# Patient Record
Sex: Male | Born: 1980 | Race: White | Hispanic: No | Marital: Married | Smoking: Never smoker
Health system: Southern US, Community
[De-identification: ages and names within clinical notes are randomized; demographics above are authoritative.]

## PROBLEM LIST (undated history)

## (undated) DIAGNOSIS — I219 Acute myocardial infarction, unspecified: Secondary | ICD-10-CM

## (undated) DIAGNOSIS — R0602 Shortness of breath: Secondary | ICD-10-CM

## (undated) DIAGNOSIS — F32A Depression, unspecified: Secondary | ICD-10-CM

## (undated) DIAGNOSIS — I259 Chronic ischemic heart disease, unspecified: Secondary | ICD-10-CM

## (undated) DIAGNOSIS — Q245 Malformation of coronary vessels: Secondary | ICD-10-CM

## (undated) DIAGNOSIS — R079 Chest pain, unspecified: Secondary | ICD-10-CM

## (undated) DIAGNOSIS — F419 Anxiety disorder, unspecified: Secondary | ICD-10-CM

## (undated) HISTORY — DX: Acute myocardial infarction, unspecified: I21.9

## (undated) HISTORY — DX: Chest pain, unspecified: R07.9

## (undated) HISTORY — DX: Malformation of coronary vessels: Q24.5

## (undated) HISTORY — DX: Shortness of breath: R06.02

## (undated) HISTORY — DX: Chronic ischemic heart disease, unspecified: I25.9

## (undated) HISTORY — DX: Depression, unspecified: F32.A

## (undated) HISTORY — PX: OTHER SURGICAL HISTORY: SHX169

## (undated) HISTORY — DX: Anxiety disorder, unspecified: F41.9

---

## 2003-04-28 ENCOUNTER — Emergency Department (HOSPITAL_COMMUNITY): Admission: EM | Admit: 2003-04-28 | Discharge: 2003-04-29 | Payer: Self-pay | Admitting: Emergency Medicine

## 2003-12-26 ENCOUNTER — Ambulatory Visit: Payer: Self-pay | Admitting: Pulmonary Disease

## 2003-12-27 ENCOUNTER — Ambulatory Visit (HOSPITAL_COMMUNITY): Admission: RE | Admit: 2003-12-27 | Discharge: 2003-12-27 | Payer: Self-pay | Admitting: Pulmonary Disease

## 2004-01-11 ENCOUNTER — Ambulatory Visit: Payer: Self-pay | Admitting: Pulmonary Disease

## 2004-02-29 ENCOUNTER — Encounter: Admission: RE | Admit: 2004-02-29 | Discharge: 2004-02-29 | Payer: Self-pay | Admitting: Neurosurgery

## 2004-05-15 ENCOUNTER — Ambulatory Visit: Payer: Self-pay | Admitting: Pulmonary Disease

## 2005-04-30 ENCOUNTER — Ambulatory Visit (HOSPITAL_COMMUNITY): Admission: RE | Admit: 2005-04-30 | Discharge: 2005-04-30 | Payer: Self-pay | Admitting: *Deleted

## 2005-04-30 HISTORY — PX: CARDIAC CATHETERIZATION: SHX172

## 2007-06-17 IMAGING — CR DG CHEST 2V
2 series · 2 of 2 positions shown · non-contrast
Comparison: none

CLINICAL DATA: Pre cardiac catheterization; chest pain.  
 CHEST - 2 VIEW:

[view not recorded (1 of 2)]
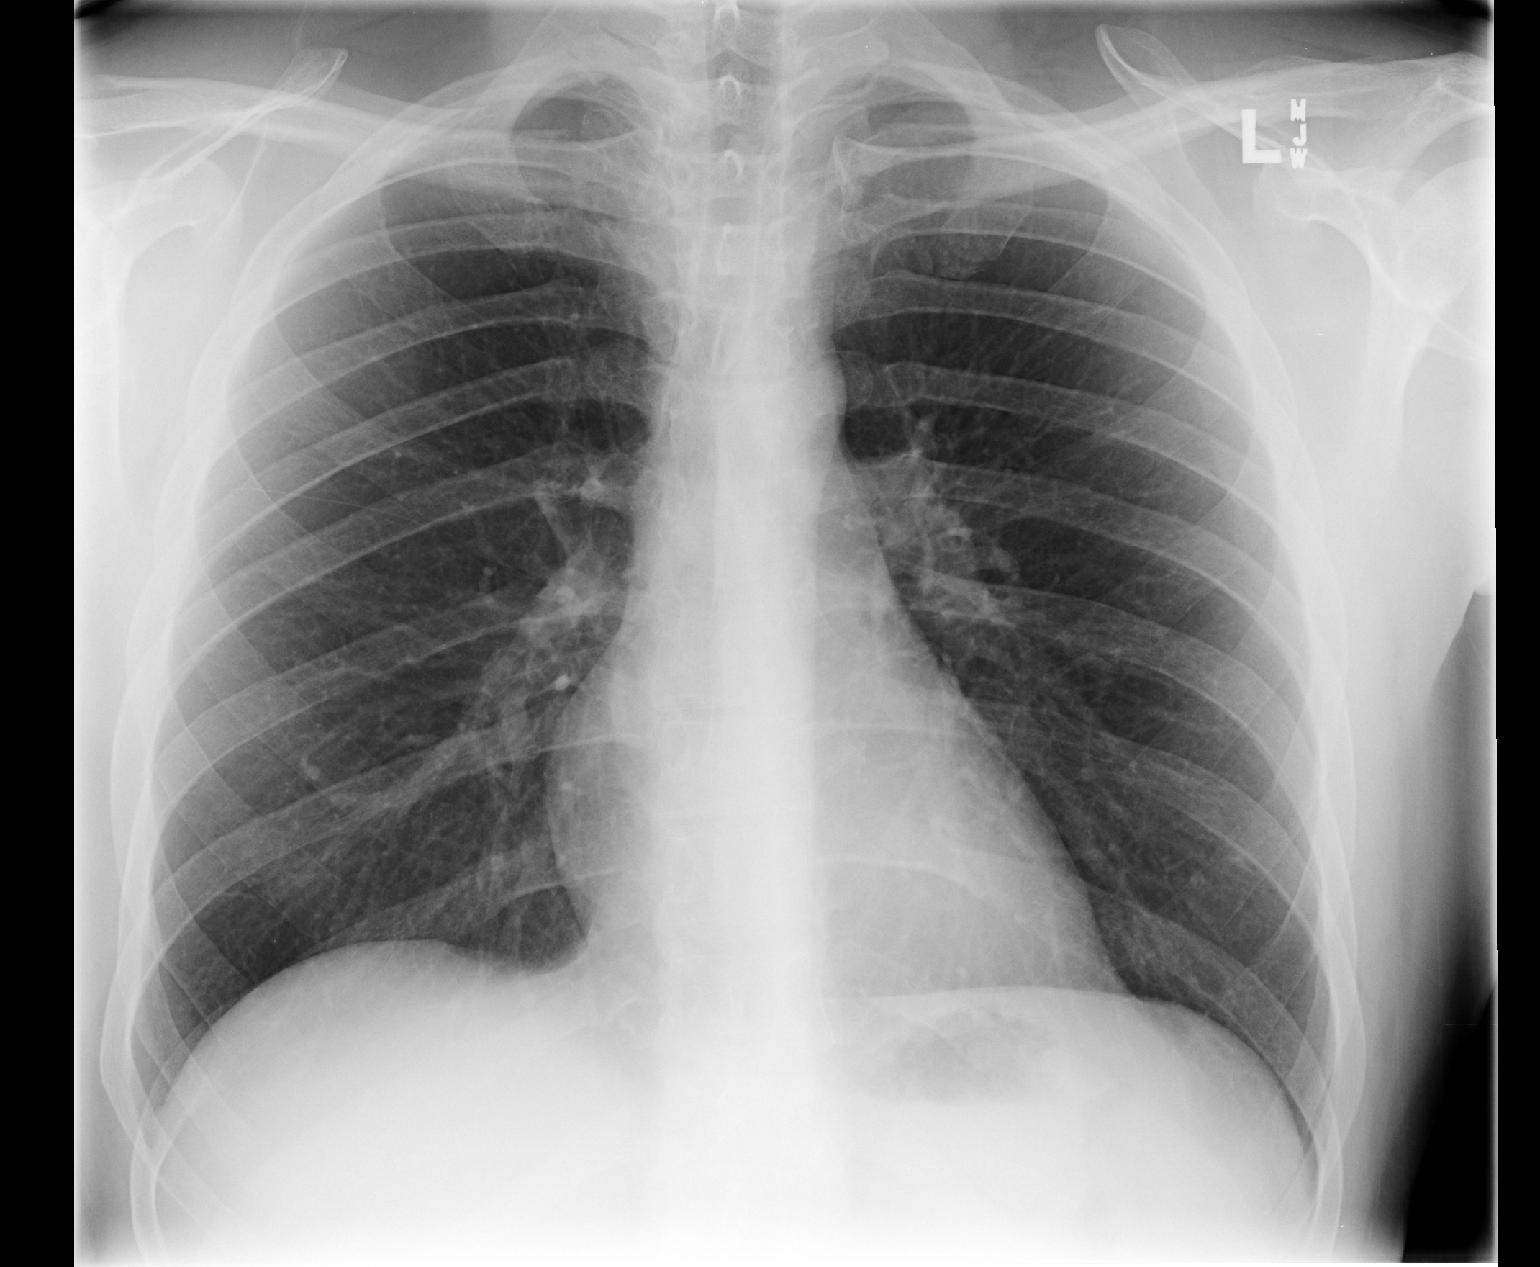

[view not recorded (2 of 2)]
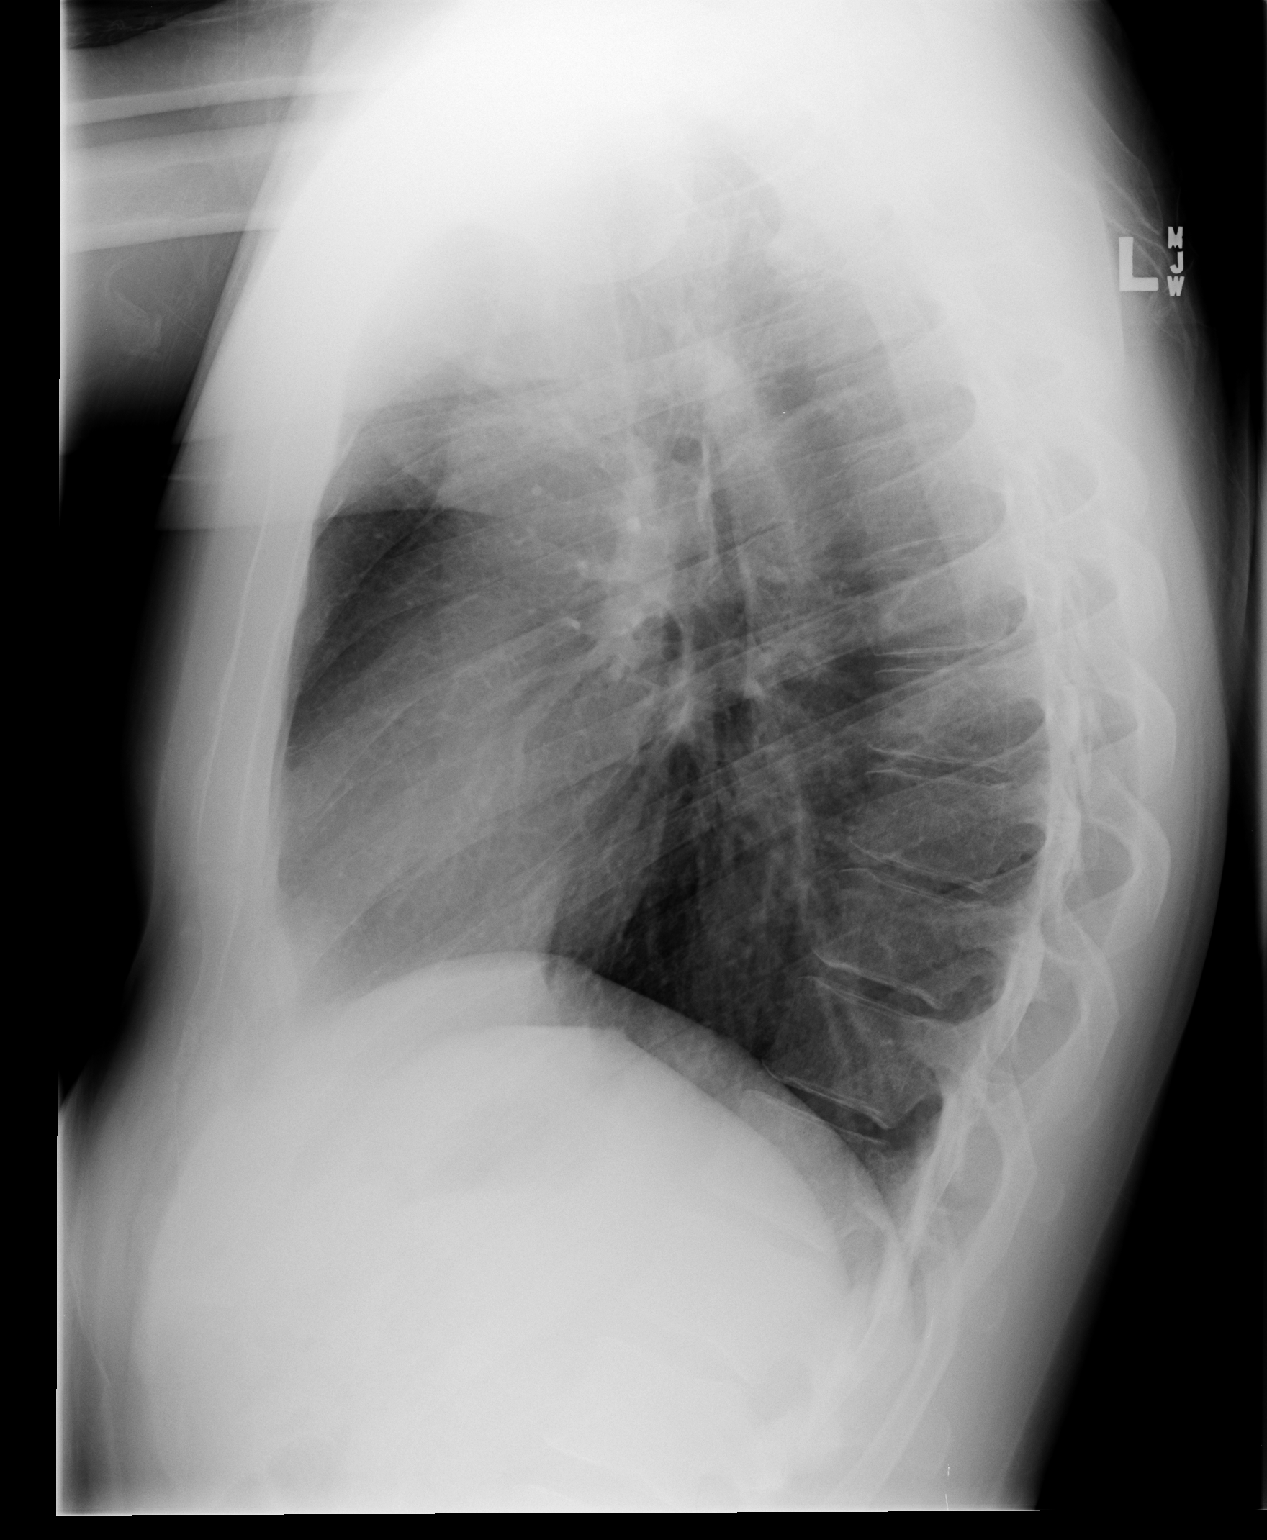

[2 of 2 positions shown; findings below may reference images not displayed]

FINDINGS: Two views of the chest show the lungs to be clear.  Probable small granuloma is present at the right lung base.  The heart is within normal limits in size.
IMPRESSION: No active lung disease.

## 2011-07-31 ENCOUNTER — Emergency Department: Payer: Self-pay | Admitting: Emergency Medicine

## 2011-07-31 LAB — TROPONIN I
Troponin-I: <0.02 ng/mL
Troponin-I: <0.02 ng/mL

## 2011-07-31 LAB — COMPREHENSIVE METABOLIC PANEL
Albumin: 4.1 g/dL (ref 3.4–5.0)
Alkaline Phosphatase: 85 U/L (ref 50–136)
Anion Gap: 6 — ABNORMAL LOW (ref 7–16)
BUN: 11 mg/dL (ref 7–18)
Bilirubin,Total: 0.5 mg/dL (ref 0.2–1.0)
Calcium, Total: 8.8 mg/dL (ref 8.5–10.1)
Chloride: 103 mmol/L (ref 98–107)
Co2: 29 mmol/L (ref 21–32)
Creatinine: 1.02 mg/dL (ref 0.60–1.30)
EGFR (African American): >60
EGFR (Non-African Amer.): >60
Glucose: 88 mg/dL (ref 65–99)
Osmolality: 274 (ref 275–301)
Potassium: 3.8 mmol/L (ref 3.5–5.1)
SGOT(AST): 21 U/L (ref 15–37)
SGPT (ALT): 36 U/L
Sodium: 138 mmol/L (ref 136–145)
Total Protein: 7.5 g/dL (ref 6.4–8.2)

## 2011-07-31 LAB — CBC
HCT: 41.2 % (ref 40.0–52.0)
HGB: 14.6 g/dL (ref 13.0–18.0)
MCH: 32.3 pg (ref 26.0–34.0)
MCHC: 35.4 g/dL (ref 32.0–36.0)
MCV: 92 fL (ref 80–100)
Platelet: 220 10*3/uL (ref 150–440)
RBC: 4.51 10*6/uL (ref 4.40–5.90)
RDW: 13.3 % (ref 11.5–14.5)
WBC: 5.2 10*3/uL (ref 3.8–10.6)

## 2011-07-31 LAB — CK TOTAL AND CKMB (NOT AT ARMC)
CK, Total: 81 U/L (ref 35–232)
CK, Total: 78 U/L (ref 35–232)
CK-MB: <0.5 ng/mL — ABNORMAL LOW (ref 0.5–3.6)
CK-MB: <0.5 ng/mL — ABNORMAL LOW (ref 0.5–3.6)

## 2011-07-31 LAB — PRO B NATRIURETIC PEPTIDE: B-Type Natriuretic Peptide: 13 pg/mL (ref 0–125)

## 2012-06-03 ENCOUNTER — Encounter: Payer: Self-pay | Admitting: Internal Medicine

## 2012-06-03 ENCOUNTER — Ambulatory Visit (INDEPENDENT_AMBULATORY_CARE_PROVIDER_SITE_OTHER): Payer: PRIVATE HEALTH INSURANCE | Admitting: Internal Medicine

## 2012-06-03 VITALS — BP 102/70 | HR 56 | Ht 73.0 in | Wt 191.0 lb

## 2012-06-03 DIAGNOSIS — R079 Chest pain, unspecified: Secondary | ICD-10-CM

## 2012-06-03 DIAGNOSIS — Q245 Malformation of coronary vessels: Secondary | ICD-10-CM

## 2012-06-03 MED ORDER — METOPROLOL TARTRATE 25 MG PO TABS: 25.0000 mg | ORAL_TABLET | Freq: Every day | ORAL | Status: DC

## 2012-06-03 MED ORDER — AMLODIPINE BESYLATE 2.5 MG PO TABS: 2.5000 mg | ORAL_TABLET | Freq: Every day | ORAL | Status: DC

## 2012-06-03 MED ORDER — ISOSORBIDE MONONITRATE ER 30 MG PO TB24: 30.0000 mg | ORAL_TABLET | Freq: Every day | ORAL | Status: DC

## 2012-06-03 NOTE — Patient Instructions (Signed)
Follow up annually. Consider trying dietary supplements as provided (L-arginine 400 mg TID, and/or COQ10 300 mg/daily)

## 2012-06-03 NOTE — Progress Notes (Signed)
OFFICE NOTE  Chief Complaint:  Routine followup  Primary Care Physician: No PCP Per Patient  HPI:  Warren Sloan is a 32 year old gentleman with history of myocardial bridging and chest pain thought due to that. He had a catheterization and a cardiac CT that confirmed this. He has also been on isosorbide, metoprolol and amlodipine and done fairly well without symptoms. Recently, he has been doing significant exercise, running 3-1/2 miles 3 times a week, also playing basketball and reports occasional chest discomfort; however, it is not lifestyle limiting. He has done very well with the medicines. He has no specific complaints today.  We recently performed metabolic testing in September of 2013, which demonstrated a peak VO2 at 81% predicted.  The heart rate and VO2 curves not demonstrate an ischemic response. There was mild chronotropic incompetence secondary to medication.  PMHx:  Past Medical History  Diagnosis Date  . Chest pain   . Shortness of breath     MET TEST, 09/29/2011  . Chronic ischemic heart disease     2D ECHO, 07/11/2003 - EF 59%, normal  . Myocardial bridge     Past Surgical History  Procedure Laterality Date  . 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 CVA Maternal Grandmother 43  . Irritable bowel syndrome Paternal Grandmother   . Hypertension Paternal Grandfather     SOCHx:   reports that he has never smoked. He does not have any smokeless tobacco history on file. He reports that  drinks alcohol. He reports that he does not use illicit drugs.  ALLERGIES:  No Known Allergies  ROS: A comprehensive review of systems was negative.  HOME MEDS: Current Outpatient Prescriptions  Medication Sig Dispense Refill  . amLODipine (NORVASC) 2.5 MG tablet Take 1 tablet (2.5 mg total) by mouth daily.  30 tablet  11  . isosorbide  mononitrate (IMDUR) 30 MG 24 hr tablet Take 1 tablet (30 mg total) by mouth daily.  30 tablet  11  . metoprolol tartrate (LOPRESSOR) 25 MG tablet Take 1 tablet (25 mg total) by mouth daily.  30 tablet  11   No current facility-administered medications for this visit.    LABS/IMAGING: No results found for this or any previous visit (from the past 48 hour(s)). No results found.  VITALS: BP 102/70  Pulse 56  Ht 6\' 1"  (1.854 m)  Wt 191 lb (86.637 kg)  BMI 25.2 kg/m2  EXAM: General appearance: alert and no distress Neck: no adenopathy, no carotid bruit, no JVD, supple, symmetrical, trachea midline and thyroid not enlarged, symmetric, no tenderness/mass/nodules Lungs: clear to auscultation bilaterally Heart: regular rate and rhythm, S1, S2 normal, no murmur, click, rub or gallop Abdomen: soft, non-tender; bowel sounds normal; no masses,  no organomegaly Extremities: extremities normal, atraumatic, no cyanosis or edema Pulses: 2+ and symmetric Skin: Skin color, texture, turgor normal. No rashes or lesions Neurologic: Grossly normal  EKG: Sinus bradycardia at 56  ASSESSMENT: 1. Coronary artery bridging 2. Chest pain associated with coronary artery spasm  PLAN: 1.   Warren Sloan is doing very well on his current regimen regimen of medications. He has very infrequent episodes of chest discomfort, mostly associated with stress.  His recent metabolic testing indicated to reduce VO2, but no evidence for ischemia. He continues to exercise and I have encouraged more aerobic exercise to help increase  his VO2.  I've also recommended arginine and/or coenzyme Q 10 supplementation which may be helpful with small vessel ischemia.  Plan to see him back annually or sooner as necessary.  Chrystie Nose, MD, [facility] Attending Cardiologist [facility]  Lorin Gawron C 06/03/2012, 1:22 PM

## 2012-07-14 ENCOUNTER — Other Ambulatory Visit: Payer: Self-pay

## 2012-07-14 DIAGNOSIS — Q245 Malformation of coronary vessels: Secondary | ICD-10-CM

## 2012-07-14 MED ORDER — AMLODIPINE BESYLATE 2.5 MG PO TABS: 2.5000 mg | ORAL_TABLET | Freq: Every day | ORAL | Status: DC

## 2012-07-14 MED ORDER — ISOSORBIDE MONONITRATE ER 30 MG PO TB24: 30.0000 mg | ORAL_TABLET | Freq: Every day | ORAL | Status: DC

## 2012-07-14 NOTE — Telephone Encounter (Signed)
Rx was called in to pharmacy. 

## 2012-07-14 NOTE — Addendum Note (Signed)
Addended by: Neta Ehlers on: 07/14/2012 05:17 PM   Modules accepted: Orders

## 2013-07-10 ENCOUNTER — Other Ambulatory Visit: Payer: Self-pay | Admitting: *Deleted

## 2013-07-10 DIAGNOSIS — Q245 Malformation of coronary vessels: Secondary | ICD-10-CM

## 2013-07-10 MED ORDER — AMLODIPINE BESYLATE 2.5 MG PO TABS: 2.5000 mg | ORAL_TABLET | Freq: Every day | ORAL | Status: DC

## 2013-08-02 ENCOUNTER — Other Ambulatory Visit: Payer: Self-pay | Admitting: Internal Medicine

## 2013-08-02 ENCOUNTER — Ambulatory Visit (INDEPENDENT_AMBULATORY_CARE_PROVIDER_SITE_OTHER): Payer: PRIVATE HEALTH INSURANCE | Admitting: Internal Medicine

## 2013-08-02 ENCOUNTER — Encounter: Payer: Self-pay | Admitting: Internal Medicine

## 2013-08-02 VITALS — BP 106/68 | HR 63 | Ht 73.0 in | Wt 175.0 lb

## 2013-08-02 DIAGNOSIS — Q245 Malformation of coronary vessels: Secondary | ICD-10-CM

## 2013-08-02 DIAGNOSIS — R072 Precordial pain: Secondary | ICD-10-CM

## 2013-08-02 MED ORDER — METOPROLOL SUCCINATE ER 25 MG PO TB24: 12.5000 mg | ORAL_TABLET | Freq: Every day | ORAL | Status: DC

## 2013-08-02 NOTE — Progress Notes (Signed)
OFFICE NOTE  Chief Complaint:  Routine followup  Primary Care Physician: No PCP Per Patient  HPI:  Warren Sloan is a 33 year old gentleman with history of myocardial bridging and chest pain thought due to that. He had a catheterization and a cardiac CT that confirmed this. He has also been on isosorbide, metoprolol and amlodipine and done fairly well without symptoms. Recently, he has been doing significant exercise, running 3-1/2 miles 3 times a week, also playing basketball and reports occasional chest discomfort; however, it is not lifestyle limiting. He has done very well with the medicines. He has no specific complaints today.  We recently performed metabolic testing in September of 2013, which demonstrated a peak VO2 at 81% predicted.  The heart rate and VO2 curves not demonstrate an ischemic response. There was mild chronotropic incompetence secondary to medication.  He returns today feeling quite well. He has very infrequent episodes of angina. He has been taking his beta blocker once daily. It is unclear whether he is on Toprol tartrate or succinate. During his cardiomegaly testing in 2013 and was mild chronotropic incompetence. He reports he does get some dizziness with positional changes.  PMHx:  Past Medical History  Diagnosis Date  . Chest pain   . Shortness of breath     MET TEST, 09/29/2011  . Chronic ischemic heart disease     2D ECHO, 07/11/2003 - EF 59%, normal  . Myocardial bridge     Past Surgical History  Procedure Laterality Date  . 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 CVA Maternal Grandmother 54  . Irritable bowel syndrome Paternal Grandmother   . Hypertension Paternal Grandfather     SOCHx:   reports that he has never smoked. He does not have any smokeless tobacco history on file. He reports that he  drinks alcohol. He reports that he does not use illicit drugs.  ALLERGIES:  No Known Allergies  ROS: A comprehensive review of systems was negative except for: Neurological: positive for dizziness  HOME MEDS: Current Outpatient Prescriptions  Medication Sig Dispense Refill  . amLODipine (NORVASC) 2.5 MG tablet Take 1 tablet (2.5 mg total) by mouth daily.  30 tablet  0  . isosorbide mononitrate (IMDUR) 30 MG 24 hr tablet Take 1 tablet (30 mg total) by mouth daily.  90 tablet  3  . metoprolol tartrate (LOPRESSOR) 25 MG tablet Take 1 tablet (25 mg total) by mouth daily.  30 tablet  11  . metoprolol succinate (TOPROL XL) 25 MG 24 hr tablet Take 0.5 tablets (12.5 mg total) by mouth daily.  15 tablet  11   No current facility-administered medications for this visit.    LABS/IMAGING: No results found for this or any previous visit (from the past 48 hour(s)). No results found.  VITALS: BP 106/68  Pulse 63  Ht '6\' 1"'  (1.854 m)  Wt 175 lb (79.379 kg)  BMI 23.09 kg/m2  EXAM: General appearance: alert and no distress Neck: no adenopathy, no carotid bruit, no JVD, supple, symmetrical, trachea midline and thyroid not enlarged, symmetric, no tenderness/mass/nodules Lungs: clear to auscultation bilaterally Heart: regular rate and rhythm, S1, S2 normal, no murmur, click, rub or gallop Abdomen: soft, non-tender; bowel sounds normal; no masses,  no organomegaly Extremities: extremities normal, atraumatic, no cyanosis or edema Pulses: 2+ and symmetric Skin: Skin color, texture,  turgor normal. No rashes or lesions Neurologic: Grossly normal  EKG: Normal sinus rhythm at 63  ASSESSMENT: 1. Coronary artery bridging 2. 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 Positional dizziness  PLAN: 1.   [name] is doing very well on his current regimen regimen of medications. He is very infrequent anginal episodes. He has been exercising more which is overall very good for his health. His  blood pressure continues to run low normal and he does have some positional dizziness. This could be do to some mild chronotropic incompetence was noted during his cardia metabolic testing. I would therefore recommend decreasing his Toprol-XL to 12.5 mg daily. He would like to wean off some medications if possible. I not sure if we can do that however the only way to his try to wean down his medicines and see if he has any more chest pain. Plan to see him back annually or sooner as necessary. He is to contact me if he is having any more angina.  Pixie Casino, MD, [facility] Attending Cardiologist [facility] 08/02/2013, 8:13 AM

## 2013-08-02 NOTE — Patient Instructions (Signed)
Your physician has recommended you make the following change in your medication... 1. Cut your current metoprolol tablets in 1/2 and take 1/2 tablet daily until you run out 2. When this medication runs out, pick up the new prescription for metoprolol succinate (Toprol XL) 12.5mg  (1/2 tablet) once daily.   Your physician wants you to follow-up in: 1 year with Dr. Rennis GoldenHilty. You will receive a reminder letter in the mail two months in advance. If you don't receive a letter, please call our office to schedule the follow-up appointment.

## 2013-08-03 ENCOUNTER — Other Ambulatory Visit: Payer: Self-pay | Admitting: *Deleted

## 2013-08-03 DIAGNOSIS — Q245 Malformation of coronary vessels: Secondary | ICD-10-CM

## 2013-08-03 MED ORDER — AMLODIPINE BESYLATE 2.5 MG PO TABS: 2.5000 mg | ORAL_TABLET | Freq: Every day | ORAL | Status: DC

## 2013-08-03 MED ORDER — METOPROLOL SUCCINATE ER 25 MG PO TB24: 12.5000 mg | ORAL_TABLET | Freq: Every day | ORAL | Status: DC

## 2013-08-03 NOTE — Telephone Encounter (Signed)
Rx was sent to pharmacy electronically. 

## 2013-08-23 ENCOUNTER — Telehealth: Payer: Self-pay | Admitting: Internal Medicine

## 2013-08-23 NOTE — Telephone Encounter (Signed)
WANTED MORE DETAILS? NO ANSWER --LEFT MESSAGE WILL INFORMED PATIENT WHEN DR HILTY REVIEWED

## 2013-08-23 NOTE — Telephone Encounter (Signed)
Pt called in stating that Dr.Hilty lowered his dosage to taking 1/2 tab of the metoprolol and he stated that he having some chest pains and would like to go back to taking the 1 tab. Please call  Thanks

## 2013-08-25 MED ORDER — METOPROLOL SUCCINATE ER 25 MG PO TB24: 25.0000 mg | ORAL_TABLET | Freq: Every day | ORAL | Status: DC

## 2013-08-25 NOTE — Telephone Encounter (Addendum)
Spoke to patient.  Aware , will e -sent new rx Metoprolol er 25 mg   #90 x3

## 2013-08-25 NOTE — Addendum Note (Signed)
Addended by: Tobin ChadMARTIN, SHARON V. on: 08/25/2013 08:08 AM   Modules accepted: Orders

## 2013-08-25 NOTE — Telephone Encounter (Signed)
Ok to go back to a full tab.  Dr. HRexene Edison

## 2013-09-16 IMAGING — CR DG CHEST 2V
1 series · 2 of 2 positions shown · non-contrast
Comparison: none

REASON FOR EXAM: Chest Pain
COMMENTS:

[Series 1: w chest pa · 0.14mm/px · 2 of 2 slices shown]
[im 1/2]
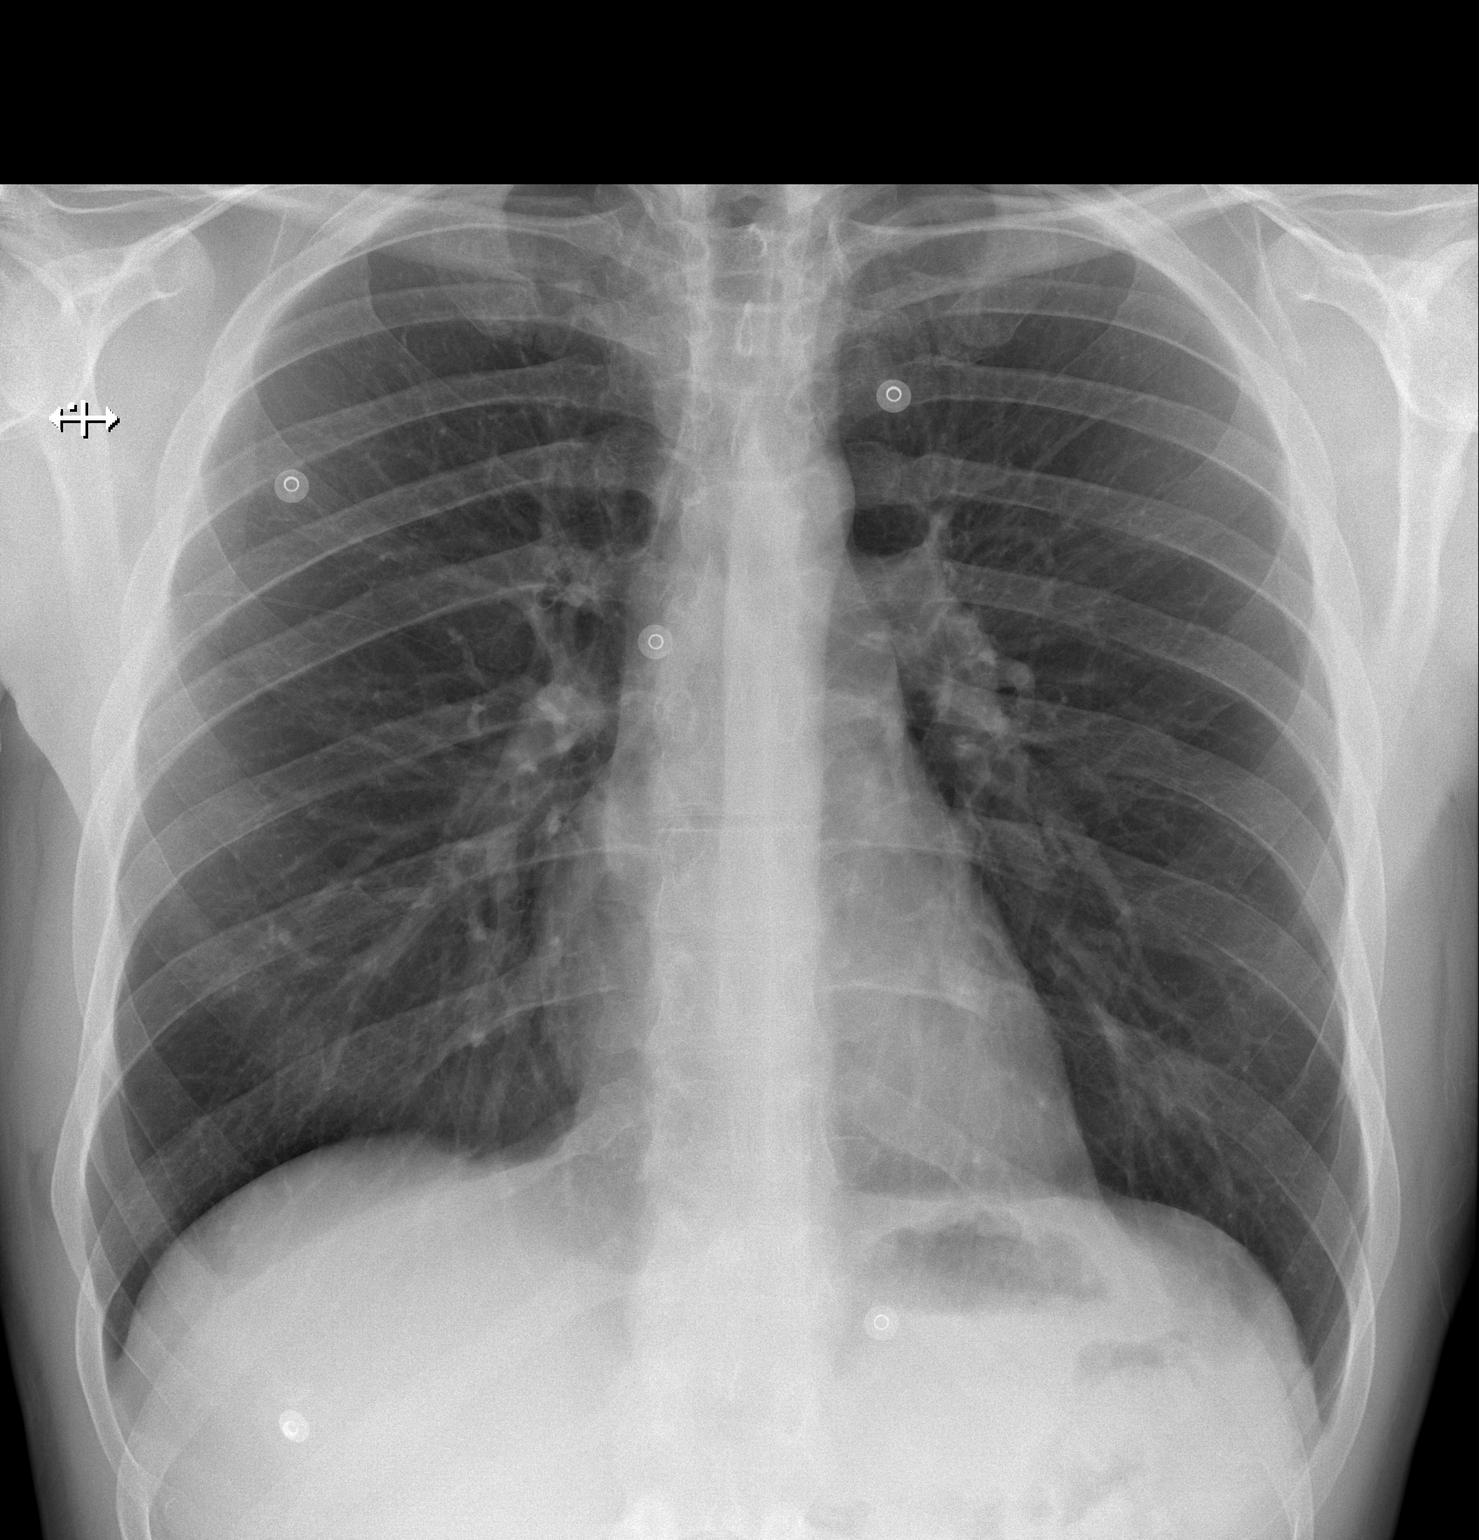
[im 2/2]
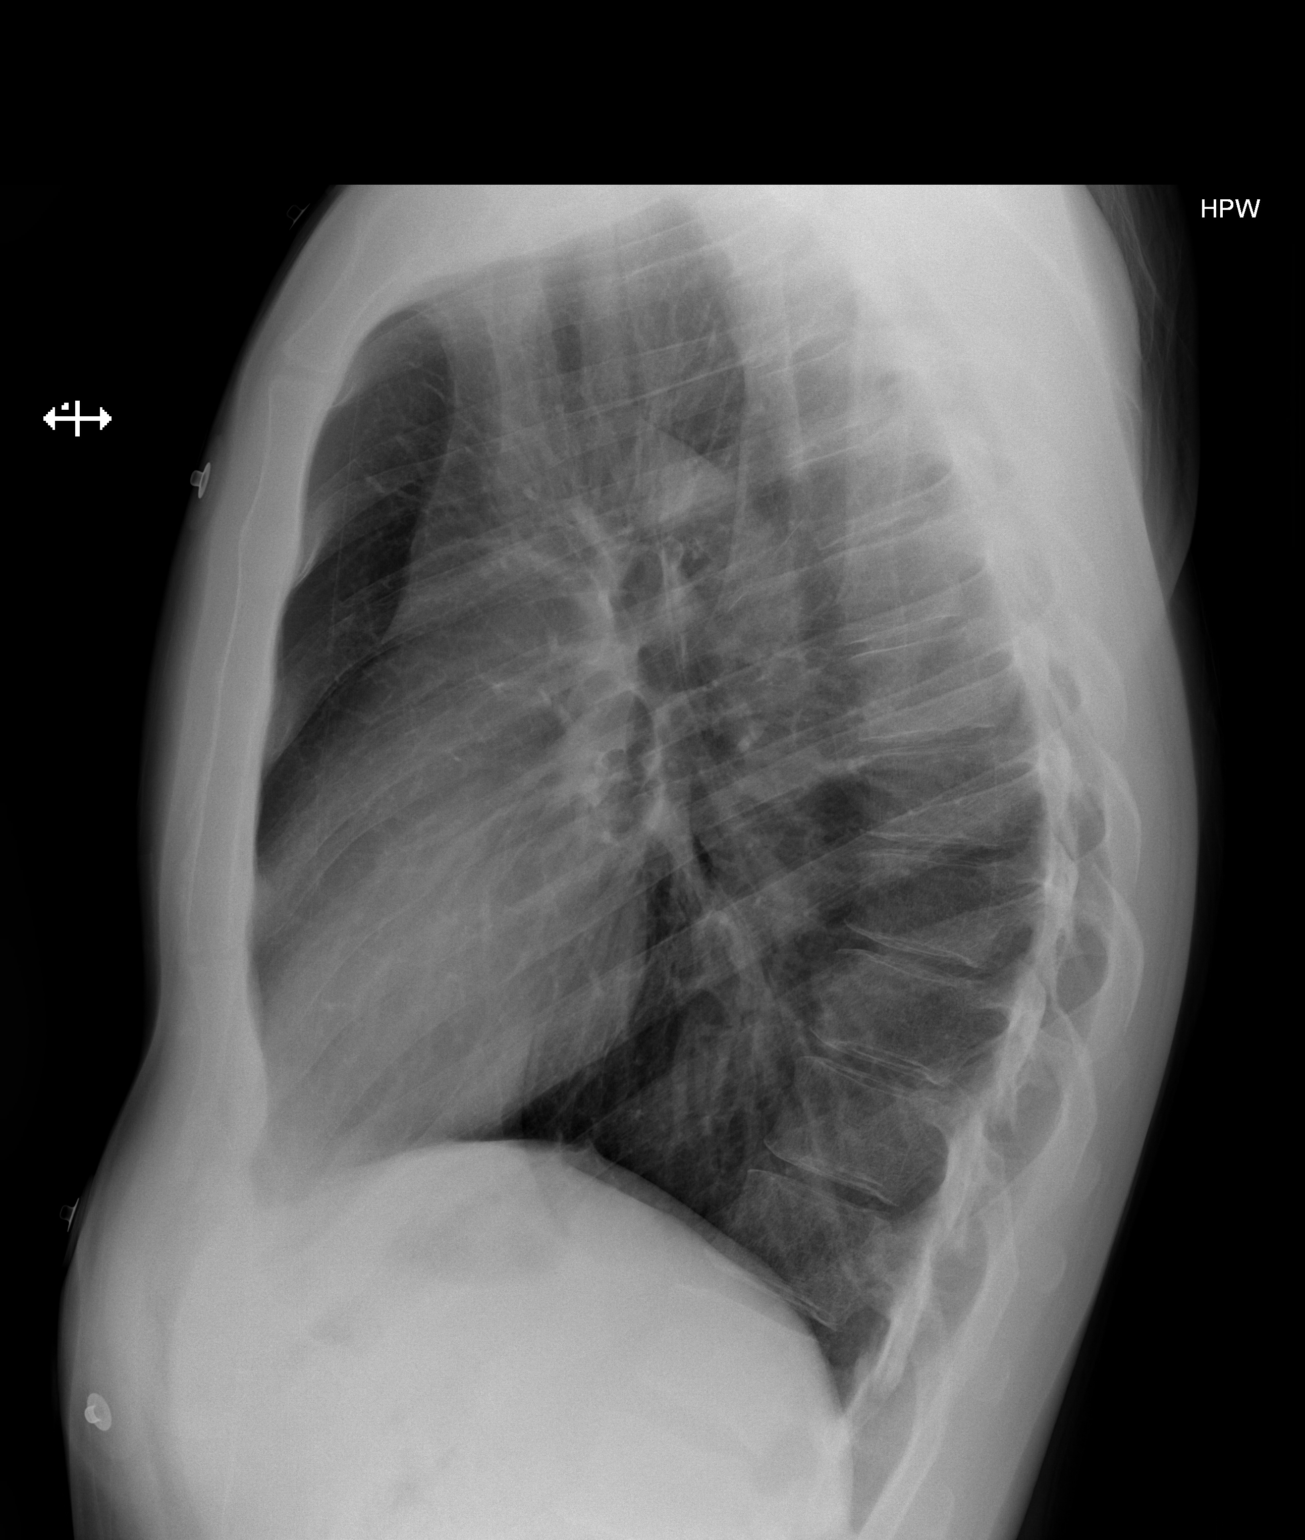

[2 of 2 positions shown; findings below may reference images not displayed]

PROCEDURE:     DXR - DXR CHEST PA (OR AP) AND LATERAL  - July 31, 2011  [DATE]

RESULT:     There is no previous exam for comparison.

The lungs are clear. The heart and pulmonary vessels are normal. The bony
and mediastinal structures are unremarkable. There is no effusion. There is
no pneumothorax or evidence of congestive failure.
IMPRESSION: No acute cardiopulmonary disease.

[REDACTED]

## 2014-03-09 ENCOUNTER — Ambulatory Visit (INDEPENDENT_AMBULATORY_CARE_PROVIDER_SITE_OTHER): Payer: PRIVATE HEALTH INSURANCE | Admitting: Internal Medicine

## 2014-03-09 ENCOUNTER — Encounter: Payer: Self-pay | Admitting: Internal Medicine

## 2014-03-09 VITALS — BP 110/80 | HR 55 | Ht 73.0 in | Wt 179.0 lb

## 2014-03-09 DIAGNOSIS — R0789 Other chest pain: Secondary | ICD-10-CM

## 2014-03-09 DIAGNOSIS — R002 Palpitations: Secondary | ICD-10-CM | POA: Insufficient documentation

## 2014-03-09 DIAGNOSIS — Q245 Malformation of coronary vessels: Secondary | ICD-10-CM

## 2014-03-09 NOTE — Patient Instructions (Signed)
Your physician has recommended you make the following change in your medication...  >> take 1/2 tablet (12.5mg ) of metoprolol succinate around 7-8am and then another dose around 4-5pm  Your physician recommends that you schedule a follow-up appointment in: 1 month

## 2014-03-09 NOTE — Progress Notes (Signed)
OFFICE NOTE  Chief Complaint:  Palpitations  Primary Care Physician: No PCP Per Patient  HPI:  Warren Sloan is a 34 year old gentleman with history of myocardial bridging and chest pain thought due to that. He had a catheterization and a cardiac CT that confirmed this. He has also been on isosorbide, metoprolol and amlodipine and done fairly well without symptoms. Recently, he has been doing significant exercise, running 3-1/2 miles 3 times a week, also playing basketball and reports occasional chest discomfort; however, it is not lifestyle limiting. He has done very well with the medicines. He has no specific complaints today.  We recently performed metabolic testing in September of 2013, which demonstrated a peak VO2 at 81% predicted.  The heart rate and VO2 curves not demonstrate an ischemic response. There was mild chronotropic incompetence secondary to medication.  He returns today feeling quite well. He has very infrequent episodes of angina. He has been taking his beta blocker once daily. It is unclear whether he is on Toprol tartrate or succinate. During his cardiomegaly testing in 2013 and was mild chronotropic incompetence. He reports he does get some dizziness with positional changes.  I saw Warren Sloan back in the office today. He is now complaining of some palpitations. He reports over the last several weeks she's had more consistent palpitations that are worse during the early afternoon. He says usually around 3 or 4:00 he starts to get some palpitations. He feels like a fluttering that comes and goes and sometimes lasts for a few seconds. He says it feels like he's either skipping beats or having extra heartbeats. He does have a history of PACs and atrial runs by monitoring the past back in 2006. It seems to have been well controlled on his beta blocker however it may be breaking through his current dose. He denies any chest pain that is different from his level chest  pain before. He continues to exercise and reports no worsening stress other than his normal amount.  PMHx:  Past Medical History  Diagnosis Date  . Chest pain   . Shortness of breath     MET TEST, 09/29/2011  . Chronic ischemic heart disease     2D ECHO, 07/11/2003 - EF 59%, normal  . Myocardial bridge     Past Surgical History  Procedure Laterality Date  . 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 CVA Maternal Grandmother 32  . Irritable bowel syndrome Paternal Grandmother   . Hypertension Paternal Grandfather     SOCHx:   reports that he has never smoked. He does not have any smokeless tobacco history on file. He reports that he drinks alcohol. He reports that he does not use illicit drugs.  ALLERGIES:  No Known Allergies  ROS: A comprehensive review of systems was negative except for: Cardiovascular: positive for palpitations  HOME MEDS: Current Outpatient Prescriptions  Medication Sig Dispense Refill  . amLODipine (NORVASC) 2.5 MG tablet Take 1 tablet (2.5 mg total) by mouth daily. 90 tablet 3  . isosorbide mononitrate (IMDUR) 30 MG 24 hr tablet TAKE ONE TABLET BY MOUTH ONCE DAILY 90 tablet 3  . metoprolol succinate (TOPROL-XL) 25 MG 24 hr tablet Take 1/2 tablet (12.51m) by mouth 7-8am. Take 1/2 tablet by mouth 4-5pm.     No current facility-administered medications for this visit.    LABS/IMAGING:  No results found for this or any previous visit (from the past 48 hour(s)). No results found.  VITALS: BP 110/80 mmHg  Pulse 55  Ht '6\' 1"'  (1.854 m)  Wt 179 lb (81.194 kg)  BMI 23.62 kg/m2  EXAM: General appearance: alert and no distress Neck: no adenopathy, no carotid bruit, no JVD, supple, symmetrical, trachea midline and thyroid not enlarged, symmetric, no tenderness/mass/nodules Lungs: clear to auscultation bilaterally Heart:  regular rate and rhythm, S1, S2 normal, no murmur, click, rub or gallop Abdomen: soft, non-tender; bowel sounds normal; no masses,  no organomegaly Extremities: extremities normal, atraumatic, no cyanosis or edema Pulses: 2+ and symmetric Skin: Skin color, texture, turgor normal. No rashes or lesions Neurologic: Grossly normal  EKG: Sinus bradycardia 55  ASSESSMENT: 1. Coronary artery bridging 2. 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 Palpitations  PLAN: 1.   Mr. Warren Sloan is doing very well on his current regimen regimen of medications. There is no significant increase in the level of his perceived anginal episodes. He is now reporting palpitations which have come more frequently. They tend to be in the afternoon. This may be associated with waning of his beta blocker. I think one option would be to consider monitoring however he does have a history of PACs and atrial tachycardia. I suspect this is more the same. He is not interested in a monitor but does want to make medication adjustment. We'll go ahead and switch him to short acting metoprolol 12.5 twice a day which is an equivalent dose. If he continues to have problems in the afternoon or evening and then he could take an extra 12.5 mg in the afternoon. He should adjust his dosing to start the morning dose around 8 or 9:00 in the afternoon dose around 4 which will provide better coverage. We could also consider dosing him 12.5 mg 3 times daily for complete daily coverage. Plan to see him back in about a month to see if this is helped his symptoms.  Pixie Casino, MD, [facility] Attending Cardiologist [facility] 03/09/2014, 12:43 PM

## 2014-03-13 ENCOUNTER — Encounter: Payer: Self-pay | Admitting: Internal Medicine

## 2014-03-14 ENCOUNTER — Ambulatory Visit (INDEPENDENT_AMBULATORY_CARE_PROVIDER_SITE_OTHER): Payer: PRIVATE HEALTH INSURANCE | Admitting: Internal Medicine

## 2014-03-14 ENCOUNTER — Encounter: Payer: Self-pay | Admitting: Internal Medicine

## 2014-03-14 VITALS — BP 110/72 | HR 62 | Temp 98.4°F | Wt 176.0 lb

## 2014-03-14 DIAGNOSIS — Q245 Malformation of coronary vessels: Secondary | ICD-10-CM

## 2014-03-14 DIAGNOSIS — F39 Unspecified mood [affective] disorder: Secondary | ICD-10-CM

## 2014-03-14 DIAGNOSIS — I259 Chronic ischemic heart disease, unspecified: Secondary | ICD-10-CM

## 2014-03-14 MED ORDER — ESCITALOPRAM OXALATE 10 MG PO TABS: 10.0000 mg | ORAL_TABLET | Freq: Every day | ORAL | Status: DC

## 2014-03-14 NOTE — Progress Notes (Signed)
Pre visit review using our clinic review tool, if applicable. No additional management support is needed unless otherwise documented below in the visit note. 

## 2014-03-14 NOTE — Assessment & Plan Note (Signed)
Worse recently Discussed treatment options with different SSRI He would like to try Lexapro again RX given Support offered He will update me in 4 weeks on how he is doing

## 2014-03-14 NOTE — Progress Notes (Signed)
HPI  Pt presents to the clinic today to establish care and for management of the conditions listed below. He has not had a PCP in many years but reports he has been seeing a PA at his work. He did have a full panel of labs 08/2013.  Flu: 11/2013 Tetanus:  > 15 years ago Dentist: biannually  Chronic Ischemic Heart Disease: Secondary to myocardial bridge. Does follow with cardiology. Just had a follow up with them 03/09/14. He is taking Metoprolol, Isosorbide and Amlodipine daily as prescribed. He occasionally has chest pain and shortness of breath.  He does reports changes in mood. He reports this started over the last 8 months. He reports he gets angrily easily and then sad at times. He does feel stressed at work. He has been treated for this about 12 years ago when he was first diagnosed with his myocardial bridge. He has been Lexapro in the past but was able to wean off it. He denies SI/HI but reports he just does not feel himself. He is interested in restarting treatment.  Past Medical History  Diagnosis Date  . Chest pain   . Shortness of breath     MET TEST, 09/29/2011  . Chronic ischemic heart disease     2D ECHO, 07/11/2003 - EF 59%, normal  . Myocardial bridge     Current Outpatient Prescriptions  Medication Sig Dispense Refill  . amLODipine (NORVASC) 2.5 MG tablet Take 1 tablet (2.5 mg total) by mouth daily. 90 tablet 3  . isosorbide mononitrate (IMDUR) 30 MG 24 hr tablet TAKE ONE TABLET BY MOUTH ONCE DAILY 90 tablet 3  . metoprolol succinate (TOPROL-XL) 25 MG 24 hr tablet Take 1/2 tablet (12.29m) by mouth 7-8am. Take 1/2 tablet by mouth 4-5pm.     No current facility-administered medications for this visit.    No Known Allergies  Family History  Problem Relation Age of Onset  . CVA Maternal Grandmother 68 . Irritable bowel syndrome Paternal Grandmother   . Hypertension Paternal Grandfather     History   Social History  . Marital Status: Married    Spouse Name: N/A  .  Number of Children: N/A  . Years of Education: N/A   Occupational History  . Not on file.   Social History Main Topics  . Smoking status: Never Smoker   . Smokeless tobacco: Never Used  . Alcohol Use: 0.0 oz/week    0 Standard drinks or equivalent per week     Comment: Occasionally  . Drug Use: No  . Sexual Activity: Not on file   Other Topics Concern  . Not on file   Social History Narrative    ROS:  Constitutional: Denies fever, malaise, fatigue, headache or abrupt weight changes.  Respiratory: Denies difficulty breathing, shortness of breath, cough or sputum production.   Cardiovascular: Denies chest pain, chest tightness, palpitations or swelling in the hands or feet.  Skin: Denies redness, rashes, lesions or ulcercations.  Neurological: Denies dizziness, difficulty with memory, difficulty with speech or problems with balance and coordination.  Psych: Pt reports anxiety and depression. Denies SI/HI.  No other specific complaints in a complete review of systems (except as listed in HPI above).  PE:  BP 110/72 mmHg  Pulse 62  Temp(Src) 98.4 F (36.9 C) (Oral)  Wt 176 lb (79.833 kg)  SpO2 98% Wt Readings from Last 3 Encounters:  03/14/14 176 lb (79.833 kg)  03/09/14 179 lb (81.194 kg)  08/02/13 175 lb (79.379 kg)  General: Appears his stated age, well developed, well nourished in NAD. Cardiovascular: Normal rate and rhythm. S1,S2 noted.  No murmur, rubs or gallops noted.  Pulmonary/Chest: Normal effort and positive vesicular breath sounds. No respiratory distress. No wheezes, rales or ronchi noted.  Neurological: Alert and oriented.  Psychiatric: Mood and affect slightly flat. Behavior is normal. Judgment and thought content normal.    Assessment and Plan:  Chronic ischemic heart disease:  Secondary to myocardial bridge Continue current medications Labs and Cardiology note reviewed Continue to follow with Cardiology  RTC in 6 months or sooner if  needed

## 2014-03-14 NOTE — Patient Instructions (Signed)

## 2014-04-16 ENCOUNTER — Ambulatory Visit: Payer: PRIVATE HEALTH INSURANCE | Admitting: Internal Medicine

## 2014-06-07 ENCOUNTER — Other Ambulatory Visit: Payer: Self-pay | Admitting: Internal Medicine

## 2014-09-07 ENCOUNTER — Other Ambulatory Visit: Payer: Self-pay | Admitting: *Deleted

## 2014-09-07 DIAGNOSIS — Q245 Malformation of coronary vessels: Secondary | ICD-10-CM

## 2014-09-07 MED ORDER — AMLODIPINE BESYLATE 2.5 MG PO TABS: 2.5000 mg | ORAL_TABLET | Freq: Every day | ORAL | Status: DC

## 2014-09-17 ENCOUNTER — Other Ambulatory Visit: Payer: Self-pay | Admitting: Internal Medicine

## 2014-10-19 ENCOUNTER — Other Ambulatory Visit: Payer: Self-pay | Admitting: Internal Medicine

## 2014-11-12 ENCOUNTER — Encounter: Payer: Self-pay | Admitting: Internal Medicine

## 2014-11-12 ENCOUNTER — Ambulatory Visit (INDEPENDENT_AMBULATORY_CARE_PROVIDER_SITE_OTHER): Payer: PRIVATE HEALTH INSURANCE | Admitting: Internal Medicine

## 2014-11-12 VITALS — BP 118/70 | HR 58 | Temp 98.4°F | Wt 182.0 lb

## 2014-11-12 DIAGNOSIS — F39 Unspecified mood [affective] disorder: Secondary | ICD-10-CM | POA: Diagnosis not present

## 2014-11-12 MED ORDER — ESCITALOPRAM OXALATE 10 MG PO TABS: 10.0000 mg | ORAL_TABLET | Freq: Every day | ORAL | Status: DC

## 2014-11-12 NOTE — Progress Notes (Signed)
Pre visit review using our clinic review tool, if applicable. No additional management support is needed unless otherwise documented below in the visit note. 

## 2014-11-12 NOTE — Assessment & Plan Note (Signed)
Controlled on Lexapro He recently had liver enzymes done at work, all normal- he will provide me with a copy  RTC in 1 year for your annual exam

## 2014-11-12 NOTE — Patient Instructions (Signed)
Dysphoria °Dysphoria is a condition in which a person feels unpleasant or uncomfortable. It may involve mood changes and feelings of sadness, anxiety, irritability, and restlessness. °Dysphoria is often caused by normal life stress and it usually goes away within several days. Dysphoria that lasts longer than several days may be a symptom of a mental disorder, such as major depression or bipolar disorder. °HOME CARE INSTRUCTIONS °Monitor your mood for any changes. Take these steps to help with your discomfort and unpleasant feelings: °· Take over-the-counter and prescription medicines only as told by your health care provider. °· Check with your health care provider before taking any herbs or supplements. °· Keep all follow-up visits as told by your health care provider. This is important. °· Maintain a healthy lifestyle. °¨ Eat a healthy diet. °¨ Exercise regularly. °¨ Get plenty of sleep. °· Avoid alcohol and drugs. °· Learn ways to reduce stress and cope with stress, such as with yoga and meditation. °· Talk about your feelings with family members or health care providers. °· Make time for yourself to do things that you enjoy. °SEEK MEDICAL CARE IF: °· You were given medicine and it does not seem to be helping. °· You feel hopeless and overwhelmed. °· You feel like you cannot leave your house. °· You have trouble taking care of yourself. °SEEK IMMEDIATE MEDICAL CARE IF:  °· You have serious thoughts about hurting yourself or others. °  °This information is not intended to replace advice given to you by your health care provider. Make sure you discuss any questions you have with your health care provider. °  °Document Released: 06/02/2005 Document Revised: 05/08/2014 Document Reviewed: 11/04/2013 °Elsevier Interactive Patient Education ©2016 [employer] ° °

## 2014-11-12 NOTE — Progress Notes (Signed)
Subjective:    Patient ID: Warren Sloan, male    DOB: 11-07-1980, 34 y.o.   MRN: [id number]  HPI  Pt presents to the clinic today for 6 month follow up of episodic mood disorder. He was restarted on Lexapro at his last visit. He never followed up with [place] to see how the medication was working. He does feel like it is working "well enough. Life is not all butterflies and rainbows". He denies overwhelming feelings of anxiety or depression. He denies SI/HI.  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 He has not had any issues over the last year.  Review of Systems    ,  Past Medical History  Diagnosis Date  . Chest pain   . Shortness of breath     MET TEST, 09/29/2011  . Chronic ischemic heart disease     2D ECHO, 07/11/2003 - EF 59%, normal  . Myocardial bridge     Current Outpatient Prescriptions  Medication Sig Dispense Refill  . amLODipine (NORVASC) 2.5 MG tablet Take 1 tablet (2.5 mg total) by mouth daily. 90 tablet 3  . escitalopram (LEXAPRO) 10 MG tablet Take 1 tablet (10 mg total) by mouth daily. 90 tablet 3  . isosorbide mononitrate (IMDUR) 30 MG 24 hr tablet TAKE ONE TABLET BY MOUTH ONCE DAILY 90 tablet 3  . metoprolol succinate (TOPROL-XL) 25 MG 24 hr tablet Take 1/2 tablet (12.13m) by mouth 7-8am. Take 1/2 tablet by mouth 4-5pm.     No current facility-administered medications for this visit.    No Known Allergies  Family History  Problem Relation Age of Onset  . CVA Maternal Grandmother 675 . Irritable bowel syndrome Paternal Grandmother   . Hypertension Paternal Grandfather   . Cancer Neg Hx     Social History   Social History  . Marital Status: Married    Spouse Name: N/A  . Number of Children: N/A  . Years of Education: N/A   Occupational History  . Not on file.   Social History Main Topics  . Smoking status: Never Smoker   . Smokeless tobacco: Never Used  . Alcohol Use: 0.0  oz/week    0 Standard drinks or equivalent per week     Comment: Occasionally  . Drug Use: No  . Sexual Activity: Yes   Other Topics Concern  . Not on file   Social History Narrative     Constitutional: Denies fever, malaise, fatigue, headache or abrupt weight changes.  Neurological: Denies dizziness, difficulty with memory, difficulty with speech or problems with balance and coordination.  Psych: Denies anxiety, depression, SI/HI.  No other specific complaints in a complete review of systems (except as listed in HPI above).  Objective:   Physical Exam  BP 118/70 mmHg  Pulse 58  Temp(Src) 98.4 F (36.9 C) (Oral)  Wt 182 lb (82.555 kg)  SpO2 98% Wt Readings from Last 3 Encounters:  11/12/14 182 lb (82.555 kg)  03/14/14 176 lb (79.833 kg)  03/09/14 179 lb (81.194 kg)    General: Appears his stated age, well developed, well nourished in NAD. Cardiovascular: Normal rate and rhythm. S1,S2 noted.   Pulmonary/Chest: Normal effort and positive vesicular breath sounds. No respiratory distress. No wheezes, rales or ronchi noted.  Neurological: Alert and oriented.  Psychiatric: Mood and affect normal. Behavior is normal. Judgment and thought content normal.    BMET    Component Value Date/Time  NA 138 07/31/2011 0822   K 3.8 07/31/2011 0822   CL 103 07/31/2011 0822   CO2 29 07/31/2011 0822   GLUCOSE 88 07/31/2011 0822   BUN 11 07/31/2011 0822   CREATININE 1.02 07/31/2011 0822   CALCIUM 8.8 07/31/2011 0822   GFRNONAA >60 07/31/2011 0822   GFRAA >60 07/31/2011 0822    Lipid Panel  No results found for: CHOL, TRIG, HDL, CHOLHDL, VLDL, LDLCALC  CBC    Component Value Date/Time   WBC 5.2 07/31/2011 0822   RBC 4.51 07/31/2011 0822   HGB 14.6 07/31/2011 0822   HCT 41.2 07/31/2011 0822   PLT 220 07/31/2011 0822   MCV 92 07/31/2011 0822   MCH 32.3 07/31/2011 0822   MCHC 35.4 07/31/2011 0822   RDW 13.3 07/31/2011 0822    Hgb A1C No results found for:  HGBA1C       Assessment & Plan:

## 2014-11-22 ENCOUNTER — Ambulatory Visit (INDEPENDENT_AMBULATORY_CARE_PROVIDER_SITE_OTHER): Payer: PRIVATE HEALTH INSURANCE | Admitting: Internal Medicine

## 2014-11-22 ENCOUNTER — Telehealth: Payer: Self-pay | Admitting: Physician Assistant

## 2014-11-22 ENCOUNTER — Telehealth: Payer: Self-pay | Admitting: Internal Medicine

## 2014-11-22 ENCOUNTER — Encounter: Payer: Self-pay | Admitting: Internal Medicine

## 2014-11-22 VITALS — BP 108/76 | HR 54 | Ht 73.0 in | Wt 181.0 lb

## 2014-11-22 DIAGNOSIS — R079 Chest pain, unspecified: Secondary | ICD-10-CM | POA: Diagnosis not present

## 2014-11-22 DIAGNOSIS — Q245 Malformation of coronary vessels: Secondary | ICD-10-CM | POA: Diagnosis not present

## 2014-11-22 DIAGNOSIS — R071 Chest pain on breathing: Secondary | ICD-10-CM

## 2014-11-22 LAB — D-DIMER, QUANTITATIVE (NOT AT ARMC): D-Dimer, Quant: 0.27 ug/mL-FEU (ref 0.00–0.48)

## 2014-11-22 LAB — TROPONIN I

## 2014-11-22 NOTE — Telephone Encounter (Signed)
Follow up scheduled with dr hilty 

## 2014-11-22 NOTE — Telephone Encounter (Signed)
Spoke with pt, he woke last night with a deep chest pain and lightheadedness that lasted about 15 to 20 min. He took extra 1/2 of the isosorbide and metoprolol. Today he cont to have a "deep" ache in his chest that goes into his back. It is present about 80-90% of the time. There is nothing he can do to make the pain better or worse. No change with movement. Denies SOB or lightheadedness today. He reports this is the owrse episode he has had in awhile. He wants to know if he needs to be seen today. Will forward for dr hilty's review.

## 2014-11-22 NOTE — Telephone Encounter (Signed)
I'm happy to see him today - there is an open slot at 4:00. Please advise him to get here 15-20 minutes early.  Thanks.  Dr. HRexene Edison

## 2014-11-22 NOTE — Progress Notes (Signed)
OFFICE NOTE  Chief Complaint:  Chest pain  Primary Care Physician: Webb Silversmith, NP  HPI:  Warren Sloan is a 34 year old gentleman with history of myocardial bridging and chest pain thought due to that. He had a catheterization and a cardiac CT that confirmed this. He has also been on isosorbide, metoprolol and amlodipine and done fairly well without symptoms. Recently, he has been doing significant exercise, running 3-1/2 miles 3 times a week, also playing basketball and reports occasional chest discomfort; however, it is not lifestyle limiting. He has done very well with the medicines. He has no specific complaints today.  We recently performed metabolic testing in September of 2013, which demonstrated a peak VO2 at 81% predicted.  The heart rate and VO2 curves not demonstrate an ischemic response. There was mild chronotropic incompetence secondary to medication.  He returns today feeling quite well. He has very infrequent episodes of angina. He has been taking his beta blocker once daily. It is unclear whether he is on Toprol tartrate or succinate. During his cardiomegaly testing in 2013 and was mild chronotropic incompetence. He reports he does get some dizziness with positional changes.  I saw Warren Sloan back in the office today. He is now complaining of some palpitations. He reports over the last several weeks she's had more consistent palpitations that are worse during the early afternoon. He says usually around 3 or 4:00 he starts to get some palpitations. He feels like a fluttering that comes and goes and sometimes lasts for a few seconds. He says it feels like he's either skipping beats or having extra heartbeats. He does have a history of PACs and atrial runs by monitoring the past back in 2006. It seems to have been well controlled on his beta blocker however it may be breaking through his current dose. He denies any chest pain that is different from his level chest pain  before. He continues to exercise and reports no worsening stress other than his normal amount.  Warren Sloan was seen in the office today for an acute visit. He called into the office after experiencing intense left-sided chest pain last night. He reported some most severe pain he's had for many years. Overall he felt like he was doing very well and in fact had cut back his medicines in half on his own as he had no further symptoms. He says he woke up out of sleep last night with a sided chest pain that radiated to the back. It was somewhat worse with breathing and took several hours to improve. He took additional medications with some mild improvement. He says he almost went to the emergency department but decided to call the office this morning. An EKG was performed in the office today which is essentially unchanged from his prior EKG.  PMHx:  Past Medical History  Diagnosis Date  . Chest pain   . Shortness of breath     MET TEST, 09/29/2011  . Chronic ischemic heart disease     2D ECHO, 07/11/2003 - EF 59%, normal  . Myocardial bridge     Past Surgical History  Procedure Laterality Date  . 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 Wisdom teeth      FAMHx:  Family History  Problem Relation Age of Onset  . CVA Maternal Grandmother 82  . Irritable bowel syndrome Paternal Grandmother   . Hypertension Paternal Grandfather   . Cancer  Neg Hx     SOCHx:   reports that he has never smoked. He has never used smokeless tobacco. He reports that he drinks alcohol. He reports that he does not use illicit drugs.  ALLERGIES:  No Known Allergies  ROS: A comprehensive review of systems was negative except for: Cardiovascular: positive for chest pain and palpitations  HOME MEDS: Current Outpatient Prescriptions  Medication Sig Dispense Refill  . amLODipine (NORVASC) 2.5 MG tablet Take 1 tablet (2.5 mg total) by mouth  daily. 90 tablet 3  . escitalopram (LEXAPRO) 10 MG tablet Take 1 tablet (10 mg total) by mouth daily. 90 tablet 3  . isosorbide mononitrate (IMDUR) 30 MG 24 hr tablet TAKE ONE TABLET BY MOUTH ONCE DAILY (Patient taking differently: TAKE HALF TABLET BY MOUTH ONCE DAILY) 90 tablet 3  . metoprolol succinate (TOPROL-XL) 25 MG 24 hr tablet Take 1/2 tablet (12.23m) by mouth 7-8am. Take 1/2 tablet by mouth 4-5pm.     No current facility-administered medications for this visit.    LABS/IMAGING: No results found for this or any previous visit (from the past 48 hour(s)). No results found.  VITALS: BP 108/76 mmHg  Pulse 54  Ht _0  (1.854 m)  Wt 181 lb (82.101 kg)  BMI 23.89 kg/m2  EXAM: General appearance: alert and no distress Lungs: clear to auscultation bilaterally Heart: regular rate and rhythm, S1, S2 normal, no murmur, click, rub or gallop Extremities: extremities normal, atraumatic, no cyanosis or edema  EKG: Sinus bradycardia 54, early repolarization changes  ASSESSMENT: 1. Acute chest pain 2. Coronary artery bridging 3. Chest pain associated with coronary artery spasm 4. Palpitations  PLAN: 1.   Mr. KBoeselreports intense chest pain which came on last night and has subsided to a great extent however was very severe. He denied any worsening shortness of breath although his symptoms were worse with taking a deep breath. EKG today shows no acute changes compared to his prior study in March of this year. He has been weaning back his medications on his own, because he felt better. This could have been a coronary spasm event, however his EKG does not suggest that this is continuing. Given the severity of his symptoms I would like to check a troponin and make sure there was no myocardial damage. In addition, I would request a d-dimer to rule out PE which is much less likely although the pain was acute onset with some pleuritic component. He is advised to go back to the full dose of  Imdur. If the troponin is abnormal he may need repeat coronary evaluation.  KPixie Casino MD, FPalacios Community Medical CenterAttending Cardiologist CWashburnC Hilty 11/22/2014, 4:22 PM

## 2014-11-22 NOTE — Patient Instructions (Signed)
Please go to first floor lab - [address]  Increase imdur to full tablet

## 2014-11-22 NOTE — Telephone Encounter (Addendum)
Pt had chest pain recently and a troponin was ordered at his office visit today.   [facility] called saying the specimen was never received.   Staff message sent to Dr Rennis GoldenHilty and Hilma FavorsBillie Robinson.

## 2014-11-22 NOTE — Telephone Encounter (Signed)
Warren Sloan is calling because he has been experiencing some chest pressure in the last 24 hrs . Please call   Thanks

## 2015-01-01 ENCOUNTER — Other Ambulatory Visit: Payer: Self-pay

## 2015-01-01 ENCOUNTER — Other Ambulatory Visit: Payer: Self-pay | Admitting: Internal Medicine

## 2015-01-01 MED ORDER — ISOSORBIDE MONONITRATE ER 30 MG PO TB24: ORAL_TABLET | ORAL | Status: DC

## 2015-01-01 MED ORDER — METOPROLOL SUCCINATE ER 25 MG PO TB24: 25.0000 mg | ORAL_TABLET | Freq: Two times a day (BID) | ORAL | Status: DC

## 2015-09-18 ENCOUNTER — Other Ambulatory Visit: Payer: Self-pay | Admitting: Internal Medicine

## 2015-09-18 DIAGNOSIS — Q245 Malformation of coronary vessels: Secondary | ICD-10-CM

## 2015-09-30 ENCOUNTER — Other Ambulatory Visit: Payer: Self-pay | Admitting: Family Medicine

## 2015-10-01 LAB — CMP12+LP+TP+TSH+6AC+CBC/D/PLT
ALT: 17 IU/L (ref 0–44)
AST: 22 IU/L (ref 0–40)
Albumin/Globulin Ratio: 1.8 (ref 1.2–2.2)
Albumin: 4.4 g/dL (ref 3.5–5.5)
Alkaline Phosphatase: 56 IU/L (ref 39–117)
BASOS: 1 %
BUN/Creatinine Ratio: 13 (ref 9–20)
BUN: 12 mg/dL (ref 6–20)
Basophils Absolute: 0 10*3/uL (ref 0.0–0.2)
Bilirubin Total: 0.3 mg/dL (ref 0.0–1.2)
CHLORIDE: 102 mmol/L (ref 96–106)
Calcium: 8.9 mg/dL (ref 8.7–10.2)
Chol/HDL Ratio: 2 ratio units (ref 0.0–5.0)
Cholesterol, Total: 159 mg/dL (ref 100–199)
Creatinine, Ser: 0.93 mg/dL (ref 0.76–1.27)
EOS (ABSOLUTE): 0 10*3/uL (ref 0.0–0.4)
Eos: 1 %
Free Thyroxine Index: 1.7 (ref 1.2–4.9)
GFR calc non Af Amer: 105 mL/min/{1.73_m2} (ref >59)
GFR, EST AFRICAN AMERICAN: 122 mL/min/{1.73_m2} (ref >59)
GGT: 25 IU/L (ref 0–65)
GLUCOSE: 81 mg/dL (ref 65–99)
Globulin, Total: 2.4 g/dL (ref 1.5–4.5)
HDL: 78 mg/dL (ref >39)
HEMATOCRIT: 39.4 % (ref 37.5–51.0)
Hemoglobin: 13 g/dL (ref 12.6–17.7)
IMMATURE GRANS (ABS): 0 10*3/uL (ref 0.0–0.1)
IRON: 62 ug/dL (ref 38–169)
Immature Granulocytes: 0 %
LDH: 148 IU/L (ref 121–224)
LDL Calculated: 71 mg/dL (ref 0–99)
LYMPHS: 38 %
Lymphocytes Absolute: 1.7 10*3/uL (ref 0.7–3.1)
MCH: 30.8 pg (ref 26.6–33.0)
MCHC: 33 g/dL (ref 31.5–35.7)
MCV: 93 fL (ref 79–97)
MONOS ABS: 0.3 10*3/uL (ref 0.1–0.9)
Monocytes: 8 %
NEUTROS ABS: 2.3 10*3/uL (ref 1.4–7.0)
Neutrophils: 52 %
PHOSPHORUS: 3.3 mg/dL (ref 2.5–4.5)
POTASSIUM: 4.1 mmol/L (ref 3.5–5.2)
Platelets: 225 10*3/uL (ref 150–379)
RBC: 4.22 x10E6/uL (ref 4.14–5.80)
RDW: 14.5 % (ref 12.3–15.4)
SODIUM: 143 mmol/L (ref 134–144)
T3 Uptake Ratio: 27 % (ref 24–39)
T4, Total: 6.3 ug/dL (ref 4.5–12.0)
TOTAL PROTEIN: 6.8 g/dL (ref 6.0–8.5)
TSH: 1.24 u[IU]/mL (ref 0.450–4.500)
Triglycerides: 51 mg/dL (ref 0–149)
URIC ACID: 5.3 mg/dL (ref 3.7–8.6)
VLDL CHOLESTEROL CAL: 10 mg/dL (ref 5–40)
WBC: 4.4 10*3/uL (ref 3.4–10.8)

## 2015-10-01 LAB — HGB A1C W/O EAG: Hgb A1c MFr Bld: 5.3 % (ref 4.8–5.6)

## 2015-10-08 ENCOUNTER — Telehealth: Payer: Self-pay | Admitting: Internal Medicine

## 2015-10-08 NOTE — Telephone Encounter (Signed)
Fine with me

## 2015-10-08 NOTE — Telephone Encounter (Signed)
Fine with me. Thanks

## 2015-10-08 NOTE — Telephone Encounter (Signed)
Patient would like to switch from [place] to [place].  Patient said he feels uncomfortable seeing Rene KocherRegina since their children go to the same school. Can patient switch to Jae DireKate?

## 2015-10-16 ENCOUNTER — Encounter: Payer: Self-pay | Admitting: Family Medicine

## 2015-10-16 ENCOUNTER — Ambulatory Visit (INDEPENDENT_AMBULATORY_CARE_PROVIDER_SITE_OTHER): Payer: PRIVATE HEALTH INSURANCE | Admitting: Family Medicine

## 2015-10-16 VITALS — BP 100/72 | HR 56 | Temp 98.2°F | Ht 73.0 in | Wt 186.5 lb

## 2015-10-16 DIAGNOSIS — M7042 Prepatellar bursitis, left knee: Secondary | ICD-10-CM | POA: Diagnosis not present

## 2015-10-16 MED ORDER — CEPHALEXIN 500 MG PO CAPS: 1000.0000 mg | ORAL_CAPSULE | Freq: Two times a day (BID) | ORAL | 0 refills | Status: DC

## 2015-10-16 MED ORDER — METHYLPREDNISOLONE ACETATE 40 MG/ML IJ SUSP
20.0000 mg | Freq: Once | INTRAMUSCULAR | Status: AC
  Administered 2015-10-16: 20 mg via INTRA_ARTICULAR

## 2015-10-16 NOTE — Progress Notes (Signed)
Pre visit review using our clinic review tool, if applicable. No additional management support is needed unless otherwise documented below in the visit note. 

## 2015-10-16 NOTE — Progress Notes (Signed)
Dr. Frederico Hamman T. Brinden Kincheloe, MD, [facility] [place], [address] Phone: [phone] Fax: [phone]  10/16/2015  Patient: Warren Sloan, MRN: [id number], DOB: [birth year], 35 y.o.  Primary Physician:  Sheral Flow, NP   Chief Complaint  Patient presents with  . Knee Pain    Left   Subjective:   Warren Sloan is a 35 y.o. very pleasant male patient who presents with the following:  Prepatellar bursitis. A couple of months. He doesn't specifically recall any kind of injury mechanism or being on his knees all that much, but he does get down and play with his children. He does have a 45-year-old 78-year-old, and they're both quite active. At this point it is minimally painful and bothersome.  Past Medical History, Surgical History, Social History, Family History, Problem List, Medications, and Allergies have been reviewed and updated if relevant.  Patient Active Problem List   Diagnosis Date Noted  . Chest pain 11/22/2014  . Episodic mood disorder ([place]) 03/14/2014  . Myocardial bridge 06/03/2012    Past Medical History:  Diagnosis Date  . Chest pain   . Chronic ischemic heart disease    2D ECHO, 07/11/2003 - EF 59%, normal  . Myocardial bridge   . Shortness of breath    MET TEST, 09/29/2011    Past Surgical History:  Procedure Laterality Date  . 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 wisdom teeth      Social History   Social History  . Marital status: Married    Spouse name: N/A  . Number of children: N/A  . Years of education: N/A   Occupational History  . Not on file.   Social History Main Topics  . Smoking status: Never Smoker  . Smokeless tobacco: Never Used  . Alcohol use 0.0 oz/week     Comment: Occasionally  . Drug use: No  . Sexual activity: Yes   Other Topics Concern  . Not on file   Social  History Narrative  . No narrative on file    Family History  Problem Relation Age of Onset  . CVA Maternal Grandmother 42  . Irritable bowel syndrome Paternal Grandmother   . Hypertension Paternal Grandfather   . Cancer Neg Hx     No Known Allergies  Medication list reviewed and updated in full in [place].  GEN: No fevers, chills. Nontoxic. Primarily MSK c/o today. MSK: Detailed in the HPI GI: tolerating PO intake without difficulty Neuro: No numbness, parasthesias, or tingling associated. Otherwise the pertinent positives of the ROS are noted above.   Objective:   BP 100/72   Pulse (!) 56   Temp 98.2 F (36.8 C) (Oral)   Ht '6\' 1"'  (1.854 m)   Wt 186 lb 8 oz (84.6 kg)   BMI 24.61 kg/m    GEN: WDWN, NAD, Non-toxic, Alert & Oriented x 3 HEENT: Atraumatic, Normocephalic.  Ears and Nose: No external deformity. EXTR: No clubbing/cyanosis/edema NEURO: Normal gait.  PSYCH: Normally interactive. Conversant. Not depressed or anxious appearing.  Calm demeanor.    Left knee has full range of motion, 0 to 1:30. Stable MCL, LCL, anterior cruciate ligament, and PCL. McMurray's and flexion pinch are all negative. Examination is grossly unremarkable with exception of a moderately full prepatellar bursitis.  Radiology: No results found.  Assessment and Plan:   Prepatellar bursitis of  left knee - Plan: methylPREDNISolone acetate (DEPO-MEDROL) injection 20 mg  Anatomy reviewed.  Prepatellar Aspiration, L Verbal consent was obtained. Risks, benefits, and alternatives discussed. Potential complications including loss of pigment, atrophy, and infection were discussed. We discussed that there is a higher risk of infection with corticosteroid injected into this superficial bursa. Prepped with Chloraprep and Ethyl Chloride used for anesthesia. Under sterile conditions, the effected prepatellar bursa was aspirated with an 18 gauge needle. 12 cc of serosanguinous fluid was  aspirated. 20 mg of Depo-medrol was injected.   A compression bandage was applied and the patient was instructed to continue for 72 hours.  Prophylactic Keflex x 7 days will be used in this topical asp/inj case.  Follow-up: prn  New Prescriptions   CEPHALEXIN (KEFLEX) 500 MG CAPSULE    Take 2 capsules (1,000 mg total) by mouth 2 (two) times daily.   Signed,  Maud Deed. Naresh Althaus, MD   Patient's Medications  New Prescriptions   CEPHALEXIN (KEFLEX) 500 MG CAPSULE    Take 2 capsules (1,000 mg total) by mouth 2 (two) times daily.  Previous Medications   AMLODIPINE (NORVASC) 2.5 MG TABLET    TAKE ONE TABLET BY MOUTH ONCE DAILY   ESCITALOPRAM (LEXAPRO) 10 MG TABLET    Take 1 tablet (10 mg total) by mouth daily.   ISOSORBIDE MONONITRATE (IMDUR) 30 MG 24 HR TABLET    TAKE HALF TABLET BY MOUTH ONCE DAILY   METOPROLOL SUCCINATE (TOPROL-XL) 25 MG 24 HR TABLET    Take 1 tablet (25 mg total) by mouth 2 (two) times daily. Take 1/2 tablet (12.58m) by mouth 7-8am. Take 1/2 tablet by mouth 4-5pm.  Modified Medications   No medications on file  Discontinued Medications   No medications on file

## 2015-11-14 ENCOUNTER — Encounter: Payer: PRIVATE HEALTH INSURANCE | Admitting: Internal Medicine

## 2015-11-25 ENCOUNTER — Other Ambulatory Visit: Payer: Self-pay | Admitting: Internal Medicine

## 2016-01-01 ENCOUNTER — Other Ambulatory Visit: Payer: Self-pay | Admitting: Internal Medicine

## 2016-01-08 ENCOUNTER — Ambulatory Visit (INDEPENDENT_AMBULATORY_CARE_PROVIDER_SITE_OTHER): Payer: PRIVATE HEALTH INSURANCE | Admitting: Primary Care

## 2016-01-08 ENCOUNTER — Encounter: Payer: Self-pay | Admitting: Primary Care

## 2016-01-08 VITALS — BP 122/76 | HR 57 | Temp 97.8°F | Ht 73.0 in | Wt 188.0 lb

## 2016-01-08 DIAGNOSIS — Q245 Malformation of coronary vessels: Secondary | ICD-10-CM | POA: Diagnosis not present

## 2016-01-08 DIAGNOSIS — F39 Unspecified mood [affective] disorder: Secondary | ICD-10-CM | POA: Diagnosis not present

## 2016-01-08 MED ORDER — ESCITALOPRAM OXALATE 10 MG PO TABS: 10.0000 mg | ORAL_TABLET | Freq: Every day | ORAL | 3 refills | Status: DC

## 2016-01-08 NOTE — Assessment & Plan Note (Signed)
Stable on Lexapro. LFT's in September 2017 stable. Refill provided today, follow up in 1 year.

## 2016-01-08 NOTE — Progress Notes (Signed)
Pre visit review using our clinic review tool, if applicable. No additional management support is needed unless otherwise documented below in the visit note. 

## 2016-01-08 NOTE — Progress Notes (Signed)
   Subjective:    Patient ID: Warren Sloan, male    DOB: January 14, 1980, 36 y.o.   MRN: [id number]  HPI  1) Ischemic Heart Disease secondary to Myocardial Bridging: Diagnosed years ago. Following with Dr. Debara Pickett through cardiology. He was last evaluated in November 2016 for complaints of left sided chest pain with palpitations. No recent visit since. He's managed on Amlodipine 2.5 mg and Toprol XL 25 mg.  2) Episodic Mood Disorder: Currently managed on Lexapro 10 mg. His last follow up was over 1 year ago. LFT's completed in 09/2015 through his occupation and were unremarkable. Overall he's feeling well managed. He denies SI/HI. He's needing a refill of his Lexapro today.  Review of Systems  Respiratory: Negative for shortness of breath.   Cardiovascular: Negative for chest pain and palpitations.  Neurological: Negative for headaches.  Psychiatric/Behavioral: Negative for sleep disturbance and suicidal ideas. The patient is not nervous/anxious.        Past Medical History:  Diagnosis Date  . Chest pain   . Chronic ischemic heart disease    2D ECHO, 07/11/2003 - EF 59%, normal  . Myocardial bridge   . Shortness of breath    MET TEST, 09/29/2011     Social History   Social History  . Marital status: Married    Spouse name: N/A  . Number of children: N/A  . Years of education: N/A   Occupational History  . Not on file.   Social History Main Topics  . Smoking status: Never Smoker  . Smokeless tobacco: Never Used  . Alcohol use 0.0 oz/week     Comment: Occasionally  . Drug use: No  . Sexual activity: Yes   Other Topics Concern  . Not on file   Social History Narrative  . No narrative on file    Past Surgical History:  Procedure Laterality Date  . 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 wisdom teeth      Family History  Problem Relation Age of Onset  . CVA Maternal Grandmother 12  .  Irritable bowel syndrome Paternal Grandmother   . Hypertension Paternal Grandfather   . Cancer Neg Hx     No Known Allergies  Current Outpatient Prescriptions on File Prior to Visit  Medication Sig Dispense Refill  . amLODipine (NORVASC) 2.5 MG tablet TAKE ONE TABLET BY MOUTH ONCE DAILY 90 tablet 3  . isosorbide mononitrate (IMDUR) 30 MG 24 hr tablet TAKE HALF TABLET BY MOUTH ONCE DAILY 90 tablet 3  . metoprolol succinate (TOPROL-XL) 25 MG 24 hr tablet Take 1 tablet (25 mg total) by mouth 2 (two) times daily. Take 1/2 tablet (12.'5mg'$ ) by mouth 7-8am. Take 1/2 tablet by mouth 4-5pm. 90 tablet 3   No current facility-administered medications on file prior to visit.     BP 122/76   Pulse (!) 57   Temp 97.8 F (36.6 C) (Oral)   Ht '6\' 1"'$  (1.854 m)   Wt 188 lb (85.3 kg)   SpO2 99%   BMI 24.80 kg/m    Objective:   Physical Exam  Constitutional: He appears well-nourished.  Neck: Neck supple.  Cardiovascular: Normal rate and regular rhythm.   Pulmonary/Chest: Effort normal and breath sounds normal.  Skin: Skin is warm and dry.  Psychiatric: He has a normal mood and affect.          Assessment & Plan:

## 2016-01-08 NOTE — Patient Instructions (Signed)
I sent refills of Lexapro to your pharmacy.   Follow up in 1 year for re-evaluation.  It was a pleasure to meet you today! Please don't hesitate to call me with any questions.

## 2016-01-08 NOTE — Assessment & Plan Note (Signed)
Following with cardiology. No chest pain/symptoms in over 1 year. Stable on Amlodipine and Toprol XL.

## 2016-02-03 ENCOUNTER — Other Ambulatory Visit: Payer: Self-pay | Admitting: Internal Medicine

## 2016-02-04 NOTE — Telephone Encounter (Signed)
Rx(s) sent to pharmacy electronically.  

## 2016-02-13 ENCOUNTER — Other Ambulatory Visit: Payer: Self-pay | Admitting: Internal Medicine

## 2016-02-13 NOTE — Telephone Encounter (Signed)
Rx(s) sent to pharmacy electronically.  

## 2016-03-09 ENCOUNTER — Ambulatory Visit (INDEPENDENT_AMBULATORY_CARE_PROVIDER_SITE_OTHER): Payer: PRIVATE HEALTH INSURANCE | Admitting: Internal Medicine

## 2016-03-09 ENCOUNTER — Encounter: Payer: Self-pay | Admitting: Internal Medicine

## 2016-03-09 VITALS — BP 96/60 | HR 54 | Ht 73.0 in | Wt 188.8 lb

## 2016-03-09 DIAGNOSIS — I201 Angina pectoris with documented spasm: Secondary | ICD-10-CM

## 2016-03-09 DIAGNOSIS — Z8679 Personal history of other diseases of the circulatory system: Secondary | ICD-10-CM | POA: Diagnosis not present

## 2016-03-09 DIAGNOSIS — Q245 Malformation of coronary vessels: Secondary | ICD-10-CM | POA: Diagnosis not present

## 2016-03-09 MED ORDER — AMLODIPINE BESYLATE 2.5 MG PO TABS: 2.5000 mg | ORAL_TABLET | Freq: Every day | ORAL | 3 refills | Status: DC

## 2016-03-09 MED ORDER — METOPROLOL SUCCINATE ER 25 MG PO TB24: 12.5000 mg | ORAL_TABLET | Freq: Every day | ORAL | 3 refills | Status: DC

## 2016-03-09 NOTE — Patient Instructions (Signed)
Your physician wants you to follow-up in: ONE YEAR with Dr. Hilty. You will receive a reminder letter in the mail two months in advance. If you don't receive a letter, please call our office to schedule the follow-up appointment.  

## 2016-03-09 NOTE — Addendum Note (Signed)
Addended by: Lindell SparELKINS, JENNA M on: 03/09/2016 08:45 AM   Modules accepted: Orders

## 2016-03-09 NOTE — Progress Notes (Signed)
OFFICE NOTE  Chief Complaint:  No complaints  Primary Care Physician: Sheral Flow, NP  HPI:  Warren Sloan is a 36 year old gentleman with history of myocardial bridging and chest pain thought due to that. He had a catheterization and a cardiac CT that confirmed this. He has also been on isosorbide, metoprolol and amlodipine and done fairly well without symptoms. Recently, he has been doing significant exercise, running 3-1/2 miles 3 times a week, also playing basketball and reports occasional chest discomfort; however, it is not lifestyle limiting. He has done very well with the medicines. He has no specific complaints today.  We recently performed metabolic testing in September of 2013, which demonstrated a peak VO2 at 81% predicted.  The heart rate and VO2 curves not demonstrate an ischemic response. There was mild chronotropic incompetence secondary to medication.  He returns today feeling quite well. He has very infrequent episodes of angina. He has been taking his beta blocker once daily. It is unclear whether he is on Toprol tartrate or succinate. During his cardiomegaly testing in 2013 and was mild chronotropic incompetence. He reports he does get some dizziness with positional changes.  I saw Warren Sloan back in the office today. He is now complaining of some palpitations. He reports over the last several weeks she's had more consistent palpitations that are worse during the early afternoon. He says usually around 3 or 4:00 he starts to get some palpitations. He feels like a fluttering that comes and goes and sometimes lasts for a few seconds. He says it feels like he's either skipping beats or having extra heartbeats. He does have a history of PACs and atrial runs by monitoring the past back in 2006. It seems to have been well controlled on his beta blocker however it may be breaking through his current dose. He denies any chest pain that is different from his level  chest pain before. He continues to exercise and reports no worsening stress other than his normal amount.  Warren Sloan was seen in the office today for an acute visit. He called into the office after experiencing intense left-sided chest pain last night. He reported some most severe pain he's had for many years. Overall he felt like he was doing very well and in fact had cut back his medicines in half on his own as he had no further symptoms. He says he woke up out of sleep last night with a sided chest pain that radiated to the back. It was somewhat worse with breathing and took several hours to improve. He took additional medications with some mild improvement. He says he almost went to the emergency department but decided to call the office this morning. An EKG was performed in the office today which is essentially unchanged from his prior EKG.  03/09/2016  Warren Sloan returns today for follow-up. He is doing very well. He has not had any significant chest discomfort over the past year. He is weaned off of isosorbide. He is currently only on low-dose amlodipine and half tablet of metoprolol succinate 25 mg daily. He is physically active exercising 4 times a week. He maintains appropriate weight has a healthy diet. Recent labs indicated Total cholesterol of 159, TG 51, HDL-C 78 and LDL-C of 71 which is excellent.  PMHx:  Past Medical History:  Diagnosis Date  . Chest pain   . Chronic ischemic heart disease    2D ECHO, 07/11/2003 - EF 59%, normal  . Myocardial bridge   . Shortness of  breath    MET TEST, 09/29/2011    Past Surgical History:  Procedure Laterality Date  . 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 wisdom teeth      FAMHx:  Family History  Problem Relation Age of Onset  . CVA Maternal Grandmother 59  . Irritable bowel syndrome Paternal Grandmother   . Hypertension Paternal Grandfather   . Cancer Neg Hx      SOCHx:   reports that he has never smoked. He has never used smokeless tobacco. He reports that he drinks alcohol. He reports that he does not use drugs.  ALLERGIES:  No Known Allergies  ROS: Pertinent items noted in HPI and remainder of comprehensive ROS otherwise negative.  HOME MEDS: Current Outpatient Prescriptions  Medication Sig Dispense Refill  . amLODipine (NORVASC) 2.5 MG tablet TAKE ONE TABLET BY MOUTH ONCE DAILY 90 tablet 3  . escitalopram (LEXAPRO) 10 MG tablet Take 1 tablet (10 mg total) by mouth daily. 90 tablet 3  . metoprolol succinate (TOPROL-XL) 25 MG 24 hr tablet TAKE ONE-HALF TABLET BY MOUTH AT 7-8AM AND ONE-HALF AT 4-5PM 30 tablet 1   No current facility-administered medications for this visit.     LABS/IMAGING: No results found for this or any previous visit (from the past 48 hour(s)). No results found.  VITALS: BP 96/60   Pulse (!) 54   Ht _0  (1.854 m)   Wt 188 lb 12.8 oz (85.6 kg)   BMI 24.91 kg/m   EXAM: General appearance: alert and no distress Lungs: clear to auscultation bilaterally Heart: regular rate and rhythm, S1, S2 normal, no murmur, click, rub or gallop Extremities: extremities normal, atraumatic, no cyanosis or edema  EKG: Sinus bradycardia 54, early repolarization changes  ASSESSMENT: 1. Coronary artery bridging 2. 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 Palpitations  PLAN: 1.   Mr. Mecca Is doing very well without any recurrent coronary spasm or chest pain. He denies any palpitations. Physically active and appears to be in good condition. Overall I think he is doing well but it is very possible he could have recurrent symptoms if he discontinues this medication. I would continue his current medications and we'll refill those today.  Follow-up annually.  Pixie Casino, MD, [facility] Attending Cardiologist [place] 03/09/2016, 8:40 AM

## 2016-06-16 ENCOUNTER — Other Ambulatory Visit: Payer: Self-pay | Admitting: Internal Medicine

## 2016-08-31 ENCOUNTER — Ambulatory Visit: Payer: Self-pay | Admitting: *Deleted

## 2016-08-31 VITALS — BP 120/73 | HR 48 | Ht 72.0 in | Wt 189.0 lb

## 2016-08-31 DIAGNOSIS — Z0000 Encounter for general adult medical examination without abnormal findings: Secondary | ICD-10-CM

## 2016-08-31 NOTE — Progress Notes (Signed)
[employer] insurance premium discount evaluation: Labs Drawn. Replacements ROI form signed. Tobacco Free Attestation form signed.  Forms placed in paper chart. Okay to route results to pcp per pt. 

## 2016-08-31 NOTE — Addendum Note (Signed)
Addended by: Loraine Grip on: 08/31/2016 09:37 AM   Modules accepted: Level of Service

## 2016-09-01 LAB — CMP12+LP+TP+TSH+6AC+CBC/D/PLT
ALBUMIN: 4.5 g/dL (ref 3.5–5.5)
ALT: 22 IU/L (ref 0–44)
AST: 22 IU/L (ref 0–40)
Albumin/Globulin Ratio: 1.6 (ref 1.2–2.2)
Alkaline Phosphatase: 67 IU/L (ref 39–117)
BASOS: 1 %
BILIRUBIN TOTAL: 0.5 mg/dL (ref 0.0–1.2)
BUN/Creatinine Ratio: 10 (ref 9–20)
BUN: 10 mg/dL (ref 6–20)
Basophils Absolute: 0.1 10*3/uL (ref 0.0–0.2)
CALCIUM: 9 mg/dL (ref 8.7–10.2)
CHLORIDE: 101 mmol/L (ref 96–106)
CHOLESTEROL TOTAL: 163 mg/dL (ref 100–199)
CREATININE: 0.97 mg/dL (ref 0.76–1.27)
Chol/HDL Ratio: 2.2 ratio (ref 0.0–5.0)
EOS (ABSOLUTE): 0 10*3/uL (ref 0.0–0.4)
EOS: 1 %
Free Thyroxine Index: 1.6 (ref 1.2–4.9)
GFR calc Af Amer: 115 mL/min/{1.73_m2} (ref >59)
GFR, EST NON AFRICAN AMERICAN: 99 mL/min/{1.73_m2} (ref >59)
GGT: 34 IU/L (ref 0–65)
GLUCOSE: 84 mg/dL (ref 65–99)
Globulin, Total: 2.8 g/dL (ref 1.5–4.5)
HDL: 75 mg/dL (ref >39)
HEMATOCRIT: 42.1 % (ref 37.5–51.0)
HEMOGLOBIN: 13.9 g/dL (ref 13.0–17.7)
IMMATURE GRANULOCYTES: 0 %
Immature Grans (Abs): 0 10*3/uL (ref 0.0–0.1)
Iron: 156 ug/dL (ref 38–169)
LDH: 130 IU/L (ref 121–224)
LDL CALC: 80 mg/dL (ref 0–99)
LYMPHS ABS: 1.8 10*3/uL (ref 0.7–3.1)
Lymphs: 47 %
MCH: 31.4 pg (ref 26.6–33.0)
MCHC: 33 g/dL (ref 31.5–35.7)
MCV: 95 fL (ref 79–97)
MONOS ABS: 0.4 10*3/uL (ref 0.1–0.9)
Monocytes: 10 %
NEUTROS ABS: 1.6 10*3/uL (ref 1.4–7.0)
Neutrophils: 41 %
Phosphorus: 2.6 mg/dL (ref 2.5–4.5)
Platelets: 222 10*3/uL (ref 150–379)
Potassium: 4.5 mmol/L (ref 3.5–5.2)
RBC: 4.43 x10E6/uL (ref 4.14–5.80)
RDW: 13.9 % (ref 12.3–15.4)
Sodium: 141 mmol/L (ref 134–144)
T3 Uptake Ratio: 25 % (ref 24–39)
T4, Total: 6.5 ug/dL (ref 4.5–12.0)
TOTAL PROTEIN: 7.3 g/dL (ref 6.0–8.5)
TSH: 1.3 u[IU]/mL (ref 0.450–4.500)
Triglycerides: 38 mg/dL (ref 0–149)
URIC ACID: 5.3 mg/dL (ref 3.7–8.6)
VLDL CHOLESTEROL CAL: 8 mg/dL (ref 5–40)
WBC: 3.8 10*3/uL (ref 3.4–10.8)

## 2016-09-01 LAB — HGB A1C W/O EAG: Hgb A1c MFr Bld: 5.3 % (ref 4.8–5.6)

## 2016-09-02 NOTE — Progress Notes (Signed)
Results reviewed with pt 09/01/16. All labs WNL. Diet and exercise recommendations for general health maintenance discussed. Copy provided to pt. Results routed to pcp per pt request. No further questions/concerns.

## 2016-09-22 ENCOUNTER — Other Ambulatory Visit: Payer: Self-pay | Admitting: Internal Medicine

## 2016-09-22 DIAGNOSIS — Q245 Malformation of coronary vessels: Secondary | ICD-10-CM

## 2017-02-11 ENCOUNTER — Other Ambulatory Visit: Payer: Self-pay | Admitting: Primary Care

## 2017-02-11 DIAGNOSIS — F39 Unspecified mood [affective] disorder: Secondary | ICD-10-CM

## 2017-03-25 ENCOUNTER — Other Ambulatory Visit: Payer: Self-pay | Admitting: Internal Medicine

## 2017-03-25 DIAGNOSIS — Q245 Malformation of coronary vessels: Secondary | ICD-10-CM

## 2017-03-26 ENCOUNTER — Other Ambulatory Visit: Payer: Self-pay | Admitting: *Deleted

## 2017-03-26 DIAGNOSIS — Q245 Malformation of coronary vessels: Secondary | ICD-10-CM

## 2017-03-26 MED ORDER — AMLODIPINE BESYLATE 2.5 MG PO TABS: 2.5000 mg | ORAL_TABLET | Freq: Every day | ORAL | 0 refills | Status: DC

## 2017-03-26 NOTE — Telephone Encounter (Signed)
Rx has been sent to the pharmacy electronically. ° °

## 2017-05-04 ENCOUNTER — Ambulatory Visit (INDEPENDENT_AMBULATORY_CARE_PROVIDER_SITE_OTHER): Payer: PRIVATE HEALTH INSURANCE | Admitting: Primary Care

## 2017-05-04 ENCOUNTER — Encounter: Payer: Self-pay | Admitting: Primary Care

## 2017-05-04 VITALS — BP 118/72 | HR 64 | Temp 98.2°F | Ht 72.0 in | Wt 187.5 lb

## 2017-05-04 DIAGNOSIS — Z0000 Encounter for general adult medical examination without abnormal findings: Secondary | ICD-10-CM | POA: Diagnosis not present

## 2017-05-04 DIAGNOSIS — Q245 Malformation of coronary vessels: Secondary | ICD-10-CM

## 2017-05-04 DIAGNOSIS — F39 Unspecified mood [affective] disorder: Secondary | ICD-10-CM

## 2017-05-04 MED ORDER — CITALOPRAM HYDROBROMIDE 20 MG PO TABS: 20.0000 mg | ORAL_TABLET | Freq: Every day | ORAL | 1 refills | Status: DC

## 2017-05-04 NOTE — Progress Notes (Signed)
Subjective:    Patient ID: Warren Sloan, male    DOB: 1980-12-26, 37 y.o.   MRN: [id number]  HPI  Warren Sloan is a 37 year old male who presents today for complete physical. He'd also like to discuss his Lexapro medication.  Currently managed on Lexapro 10 mg for which he's been taking for 2 years, he's also taken this intermittently throughout the years. Overall he feels well managed, but will experience "mood swings" once weekly on average that have been present for the past  6 months. Symptoms include feeling irritable, sad, defensive. He's been more bothered by his symptoms recently which can change rapidly. He denies SI/HI.  Immunizations: -Tetanus: Completed in 2016 -Influenza: Completed this season.   Diet: He endorses a healthy diet. Breakfast: Skips Lunch: Salad, yogurt Dinner: Chicken, fish, vegetables Snacks: Goldfish Desserts: None Beverages: Diet soda, water   Exercise: He exercises 4 days weekly through videos  Eye exam: Completed in December 2018 Dental exam: Completes semi-annually     Review of Systems  Constitutional: Negative for unexpected weight change.  HENT: Negative for rhinorrhea.   Respiratory: Negative for cough and shortness of breath.   Cardiovascular: Negative for chest pain.  Gastrointestinal: Negative for constipation and diarrhea.  Genitourinary: Negative for difficulty urinating.  Musculoskeletal: Negative for arthralgias and myalgias.  Skin: Negative for rash.  Allergic/Immunologic: Negative for environmental allergies.  Neurological: Negative for dizziness, numbness and headaches.  Psychiatric/Behavioral:       See HPI       Past Medical History:  Diagnosis Date  . Chest pain   . Chronic ischemic heart disease    2D ECHO, 07/11/2003 - EF 59%, normal  . Myocardial bridge   . Shortness of breath    MET TEST, 09/29/2011     Social History   Socioeconomic History  . Marital status: Married    Spouse name: Not on file  .  Number of children: Not on file  . Years of education: Not on file  . Highest education level: Not on file  Occupational History  . Not on file  Social Needs  . Financial resource strain: Not on file  . Food insecurity:    Worry: Not on file    Inability: Not on file  . Transportation needs:    Medical: Not on file    Non-medical: Not on file  Tobacco Use  . Smoking status: Never Smoker  . Smokeless tobacco: Never Used  Substance and Sexual Activity  . Alcohol use: Yes    Alcohol/week: 0.0 oz    Comment: Occasionally  . Drug use: No  . Sexual activity: Yes  Lifestyle  . Physical activity:    Days per week: Not on file    Minutes per session: Not on file  . Stress: Not on file  Relationships  . Social connections:    Talks on phone: Not on file    Gets together: Not on file    Attends religious service: Not on file    Active member of club or organization: Not on file    Attends meetings of clubs or organizations: Not on file    Relationship status: Not on file  . Intimate partner violence:    Fear of current or ex partner: Not on file    Emotionally abused: Not on file    Physically abused: Not on file    Forced sexual activity: Not on file  Other Topics Concern  . Not on file  Social History Narrative  . Not on file    Past Surgical History:  Procedure Laterality Date  . 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 wisdom teeth      Family History  Problem Relation Age of Onset  . CVA Maternal Grandmother 40  . Irritable bowel syndrome Paternal Grandmother   . Hypertension Paternal Grandfather   . Cancer Neg Hx     No Known Allergies  Current Outpatient Medications on File Prior to Visit  Medication Sig Dispense Refill  . amLODipine (NORVASC) 2.5 MG tablet Take 1 tablet (2.5 mg total) by mouth daily. NEED OV. 90 tablet 0  . escitalopram (LEXAPRO) 10 MG tablet Take 1 tablet (10  mg total) by mouth daily. MUST SCHEDULE OFFICE VISIT BEFORE NEXT REFILL 90 tablet 0  . metoprolol succinate (TOPROL-XL) 25 MG 24 hr tablet Take 0.5 tablets (12.5 mg total) by mouth daily. 45 tablet 3   No current facility-administered medications on file prior to visit.     BP 118/72   Pulse 64   Temp 98.2 F (36.8 C) (Oral)   Ht 6' (1.829 m)   Wt 187 lb 8 oz (85 kg)   SpO2 98%   BMI 25.43 kg/m    Objective:   Physical Exam  Constitutional: He is oriented to person, place, and time. He appears well-nourished.  HENT:  Right Ear: Tympanic membrane and ear canal normal.  Left Ear: Tympanic membrane and ear canal normal.  Nose: Nose normal. Right sinus exhibits no maxillary sinus tenderness and no frontal sinus tenderness. Left sinus exhibits no maxillary sinus tenderness and no frontal sinus tenderness.  Mouth/Throat: Oropharynx is clear and moist.  Eyes: Pupils are equal, round, and reactive to light. Conjunctivae and EOM are normal.  Neck: Neck supple. Carotid bruit is not present. No thyromegaly present.  Cardiovascular: Normal rate, regular rhythm and normal heart sounds.  Pulmonary/Chest: Effort normal and breath sounds normal. He has no wheezes. He has no rales.  Abdominal: Soft. Bowel sounds are normal. There is no tenderness.  Musculoskeletal: Normal range of motion.  Neurological: He is alert and oriented to person, place, and time. He has normal reflexes. No cranial nerve deficit.  Skin: Skin is warm and dry.  Psychiatric: He has a normal mood and affect.          Assessment & Plan:

## 2017-05-04 NOTE — Assessment & Plan Note (Signed)
Following with cardiology annually. He plans on scheduling an appointment soon. Continue Amlodipine 2.5 mg and metoprolol succinate 25 mg.

## 2017-05-04 NOTE — Assessment & Plan Note (Signed)
Overall doing well on Lexapro except for intermittent mood swings that have been occuring weekly for the past 6 months.  Discussed different treatment options, he'd like to try another medication. Will stop Lexapro, switch to Celexa. Will see him back in the office in 4-6 weeks for re-evaluation.   Denies SI/HI.

## 2017-05-04 NOTE — Assessment & Plan Note (Signed)
Td UTD. Commended him on a healthy lifestyle, encouraged to continue.  Exam unremarkable. Labs reviewed from August through occupation.  Follow up in 1 year for CPE.

## 2017-05-04 NOTE — Patient Instructions (Signed)
Continue exercising. You should be getting 150 minutes of moderate intensity exercise weekly.  Continue to eat a healthy diet.   Ensure you are consuming 64 ounces of water daily.  Stop Lexapro, start citalopram.   Please schedule a follow up visit in 6 weeks for re-evaluation. Please message me sooner if you encounter any other problems.   It was a pleasure to see you today!   Preventive Care 18-39 Years, Male Preventive care refers to lifestyle choices and visits with your health care provider that can promote health and wellness. What does preventive care include?  A yearly physical exam. This is also called an annual well check.  Dental exams once or twice a year.  Routine eye exams. Ask your health care provider how often you should have your eyes checked.  Personal lifestyle choices, including: ? Daily care of your teeth and gums. ? Regular physical activity. ? Eating a healthy diet. ? Avoiding tobacco and drug use. ? Limiting alcohol use. ? Practicing safe sex. What happens during an annual well check? The services and screenings done by your health care provider during your annual well check will depend on your age, overall health, lifestyle risk factors, and family history of disease. Counseling Your health care provider may ask you questions about your:  Alcohol use.  Tobacco use.  Drug use.  Emotional well-being.  Home and relationship well-being.  Sexual activity.  Eating habits.  Work and work Statistician.  Screening You may have the following tests or measurements:  Height, weight, and BMI.  Blood pressure.  Lipid and cholesterol levels. These may be checked every 5 years starting at age 75.  Diabetes screening. This is done by checking your blood sugar (glucose) after you have not eaten for a while (fasting).  Skin check.  Hepatitis C blood test.  Hepatitis B blood test.  Sexually transmitted disease (STD) testing.  Discuss your  test results, treatment options, and if necessary, the need for more tests with your health care provider. Vaccines Your health care provider may recommend certain vaccines, such as:  Influenza vaccine. This is recommended every year.  Tetanus, diphtheria, and acellular pertussis (Tdap, Td) vaccine. You may need a Td booster every 10 years.  Varicella vaccine. You may need this if you have not been vaccinated.  HPV vaccine. If you are 31 or younger, you may need three doses over 6 months.  Measles, mumps, and rubella (MMR) vaccine. You may need at least one dose of MMR.You may also need a second dose.  Pneumococcal 13-valent conjugate (PCV13) vaccine. You may need this if you have certain conditions and have not been vaccinated.  Pneumococcal polysaccharide (PPSV23) vaccine. You may need one or two doses if you smoke cigarettes or if you have certain conditions.  Meningococcal vaccine. One dose is recommended if you are age 24-21 years and a first-year college student living in a residence hall, or if you have one of several medical conditions. You may also need additional booster doses.  Hepatitis A vaccine. You may need this if you have certain conditions or if you travel or work in places where you may be exposed to hepatitis A.  Hepatitis B vaccine. You may need this if you have certain conditions or if you travel or work in places where you may be exposed to hepatitis B.  Haemophilus influenzae type b (Hib) vaccine. You may need this if you have certain risk factors.  Talk to your health care provider about which screenings  and vaccines you need and how often you need them. This information is not intended to replace advice given to you by your health care provider. Make sure you discuss any questions you have with your health care provider. Document Released: 02/17/2001 Document Revised: 09/11/2015 Document Reviewed: 10/23/2014 Elsevier Interactive Patient Education  [employer].

## 2017-05-14 ENCOUNTER — Telehealth: Payer: Self-pay | Admitting: Internal Medicine

## 2017-05-14 NOTE — Telephone Encounter (Signed)
Please notify patient that I heard back from his cardiologist who agrees that he can just see me for follow up and refills. Is he needing refills of his metoprolol and amlodipine? Is he still taking 1/2 tablet (12.5 mg) of metoprolol succinate?

## 2017-05-14 NOTE — Telephone Encounter (Signed)
Returned call to patient.He stated he wanted to ask Dr.Hilty if ok to see him as needed.He just wants to see PCP, if that is ok with Dr.Hilty.Advised I will send message to Dr.Hilty.

## 2017-05-14 NOTE — Telephone Encounter (Signed)
New message   Patient calling to ask if he can be followed by his PCP only and NOT continue to see cardiologist. Patient states his PCP is willing to renew medication. He would like to know with his diagnosis would this be appropriate.

## 2017-05-14 NOTE — Telephone Encounter (Signed)
Spoken and notified patient of Warren Sloan comments. Patient is not sure if he need a refill. He will check and call back. Also patient stated that yes, he is still taking 1/2 tablet of the metoprolol which is 12.5

## 2017-05-14 NOTE — Telephone Encounter (Signed)
That's fine .Marland Kitchen If PCP willing to prescribe medications. We're here if he needs [place].  Dr. Rexene Edison

## 2017-05-14 NOTE — Telephone Encounter (Signed)
Returned call to patient Dr.Hilty's advice given.Advised I will send message to PCP Vernona Rieger NP.

## 2017-05-17 NOTE — Telephone Encounter (Signed)
Noted  

## 2017-06-15 ENCOUNTER — Ambulatory Visit (INDEPENDENT_AMBULATORY_CARE_PROVIDER_SITE_OTHER): Payer: PRIVATE HEALTH INSURANCE | Admitting: Primary Care

## 2017-06-15 DIAGNOSIS — F39 Unspecified mood [affective] disorder: Secondary | ICD-10-CM

## 2017-06-15 MED ORDER — CITALOPRAM HYDROBROMIDE 20 MG PO TABS: 20.0000 mg | ORAL_TABLET | Freq: Every day | ORAL | 2 refills | Status: DC

## 2017-06-15 NOTE — Progress Notes (Signed)
Subjective:    Patient ID: Warren Sloan, male    DOB: 02-21-1980, 37 y.o.   MRN: [id number]  HPI  Warren Sloan is a 37 year old male who presents today for follow up.  He was last evaluated on 05/04/17 with reports of mood swings once weekly with symptoms of feeling irritable, sad, defensive. He had been on Lexapro for 2 years and didn't feel it was as effective so he was switched to Celexa.  Since his last visit he's noticed some improvement. He finds that he's not offended as much and doesn't take things personally. Overall he isn't bothered by things that were bothering him. He denies SI/HI, headaches, nausea. He'd like to continue with Celexa.  Review of Systems  Respiratory: Negative for shortness of breath.   Cardiovascular: Negative for chest pain.  Gastrointestinal: Negative for nausea.  Neurological: Negative for headaches.  Psychiatric/Behavioral: Negative for suicidal ideas.       See HPI       Past Medical History:  Diagnosis Date  . Chest pain   . Chronic ischemic heart disease    2D ECHO, 07/11/2003 - EF 59%, normal  . Myocardial bridge   . Shortness of breath    MET TEST, 09/29/2011     Social History   Socioeconomic History  . Marital status: Married    Spouse name: Not on file  . Number of children: Not on file  . Years of education: Not on file  . Highest education level: Not on file  Occupational History  . Not on file  Social Needs  . Financial resource strain: Not on file  . Food insecurity:    Worry: Not on file    Inability: Not on file  . Transportation needs:    Medical: Not on file    Non-medical: Not on file  Tobacco Use  . Smoking status: Never Smoker  . Smokeless tobacco: Never Used  Substance and Sexual Activity  . Alcohol use: Yes    Alcohol/week: 0.0 oz    Comment: Occasionally  . Drug use: No  . Sexual activity: Yes  Lifestyle  . Physical activity:    Days per week: Not on file    Minutes per session: Not on file  .  Stress: Not on file  Relationships  . Social connections:    Talks on phone: Not on file    Gets together: Not on file    Attends religious service: Not on file    Active member of club or organization: Not on file    Attends meetings of clubs or organizations: Not on file    Relationship status: Not on file  . Intimate partner violence:    Fear of current or ex partner: Not on file    Emotionally abused: Not on file    Physically abused: Not on file    Forced sexual activity: Not on file  Other Topics Concern  . Not on file  Social History Narrative  . Not on file    Past Surgical History:  Procedure Laterality Date  . 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 wisdom teeth      Family History  Problem Relation Age of Onset  . CVA Maternal Grandmother 70  . Irritable bowel syndrome Paternal Grandmother   . Hypertension Paternal Grandfather   . Cancer Neg Hx     No Known Allergies  Current Outpatient  Medications on File Prior to Visit  Medication Sig Dispense Refill  . amLODipine (NORVASC) 2.5 MG tablet Take 1 tablet (2.5 mg total) by mouth daily. NEED OV. 90 tablet 0  . metoprolol succinate (TOPROL-XL) 25 MG 24 hr tablet Take 0.5 tablets (12.5 mg total) by mouth daily. 45 tablet 3   No current facility-administered medications on file prior to visit.     BP 116/70   Pulse (!) 54   Temp 98 F (36.7 C) (Oral)   Ht 6' (1.829 m)   Wt 186 lb 12 oz (84.7 kg)   SpO2 98%   BMI 25.33 kg/m    Objective:   Physical Exam  Constitutional: He appears well-nourished.  Neck: Neck supple.  Cardiovascular: Normal rate and regular rhythm.  Respiratory: Effort normal and breath sounds normal.  Skin: Skin is warm and dry.  Psychiatric: He has a normal mood and affect.           Assessment & Plan:

## 2017-06-15 NOTE — Patient Instructions (Signed)
Continue taking citalopram 20 mg tablets for mood disorder.  Please update me through My Chart if anything changes, otherwise we'll see you next year.   It was a pleasure to see you today!

## 2017-06-15 NOTE — Assessment & Plan Note (Signed)
Improved since switching from Lexapro to Celexa, continue same. Refills sent to pharmacy.

## 2017-06-30 ENCOUNTER — Other Ambulatory Visit: Payer: Self-pay | Admitting: Internal Medicine

## 2017-06-30 DIAGNOSIS — Q245 Malformation of coronary vessels: Secondary | ICD-10-CM

## 2017-10-08 ENCOUNTER — Ambulatory Visit: Payer: Self-pay

## 2017-10-08 ENCOUNTER — Other Ambulatory Visit: Payer: Self-pay | Admitting: Primary Care

## 2017-10-08 DIAGNOSIS — Q245 Malformation of coronary vessels: Secondary | ICD-10-CM

## 2017-10-08 MED ORDER — AMLODIPINE BESYLATE 2.5 MG PO TABS: ORAL_TABLET | ORAL | 0 refills | Status: DC

## 2017-11-02 ENCOUNTER — Other Ambulatory Visit: Payer: Self-pay

## 2017-11-02 ENCOUNTER — Telehealth: Payer: Self-pay | Admitting: Primary Care

## 2017-11-02 NOTE — Telephone Encounter (Signed)
Received faxed refill request. Rx have not been filled by Warren Sloan. Last seen on 05/04/2017

## 2017-11-03 DIAGNOSIS — I201 Angina pectoris with documented spasm: Secondary | ICD-10-CM

## 2017-11-03 DIAGNOSIS — Q245 Malformation of coronary vessels: Secondary | ICD-10-CM

## 2017-11-03 MED ORDER — METOPROLOL SUCCINATE ER 25 MG PO TB24: 12.5000 mg | ORAL_TABLET | Freq: Every day | ORAL | 1 refills | Status: DC

## 2017-11-03 NOTE — Telephone Encounter (Signed)
Refilled this afternoon.

## 2017-11-10 NOTE — Telephone Encounter (Signed)
Noted, mychart message sent to patient.  

## 2017-11-10 NOTE — Telephone Encounter (Signed)
April at [facility] [place] left v/m; April said another pharmacist received refill metoprolol last week but thinks needs different directions; pt has been taking metoprolol 25 mg taking 1/2 tab in AM between 7 - 8 AM and another 1/2 tab in PM between 4 - 5 PM. Pt requesting 90 day supply to [facility] [address] [facility].Please advise. Last annual exam 05/04/17.

## 2017-11-11 NOTE — Telephone Encounter (Signed)
Per DPR, left detail message of Kate Clark's comments for patient to call back 

## 2017-11-11 NOTE — Telephone Encounter (Signed)
Will you please call patient? He has not yet read my message.

## 2018-02-02 ENCOUNTER — Other Ambulatory Visit: Payer: Self-pay | Admitting: Primary Care

## 2018-02-02 DIAGNOSIS — Q245 Malformation of coronary vessels: Secondary | ICD-10-CM

## 2018-03-22 ENCOUNTER — Other Ambulatory Visit: Payer: Self-pay | Admitting: Primary Care

## 2018-03-22 DIAGNOSIS — F39 Unspecified mood [affective] disorder: Secondary | ICD-10-CM

## 2018-04-22 ENCOUNTER — Other Ambulatory Visit: Payer: Self-pay | Admitting: Primary Care

## 2018-04-22 DIAGNOSIS — I201 Angina pectoris with documented spasm: Secondary | ICD-10-CM

## 2018-04-22 DIAGNOSIS — Q245 Malformation of coronary vessels: Secondary | ICD-10-CM

## 2018-05-27 ENCOUNTER — Telehealth (INDEPENDENT_AMBULATORY_CARE_PROVIDER_SITE_OTHER): Payer: PRIVATE HEALTH INSURANCE | Admitting: Primary Care

## 2018-05-27 ENCOUNTER — Encounter: Payer: Self-pay | Admitting: Primary Care

## 2018-05-27 ENCOUNTER — Telehealth: Payer: PRIVATE HEALTH INSURANCE | Admitting: Primary Care

## 2018-05-27 VITALS — Wt 186.0 lb

## 2018-05-27 DIAGNOSIS — Z0000 Encounter for general adult medical examination without abnormal findings: Secondary | ICD-10-CM

## 2018-05-27 DIAGNOSIS — F39 Unspecified mood [affective] disorder: Secondary | ICD-10-CM

## 2018-05-27 DIAGNOSIS — Q245 Malformation of coronary vessels: Secondary | ICD-10-CM | POA: Diagnosis not present

## 2018-05-27 NOTE — Progress Notes (Signed)
Subjective:    Patient ID: Warren Sloan, male    DOB: 12/17/1980, 38 y.o.   MRN: [id number]  HPI  Virtual Visit via Video Note  I connected with Warren Sloan on 05/27/18 at  2:40 PM EDT by a video enabled telemedicine application and verified that I am speaking with the correct person using two identifiers.  Location: Patient: Home Provider: Office   I discussed the limitations of evaluation and management by telemedicine and the availability of in person appointments. The patient expressed understanding and agreed to proceed.  History of Present Illness:  Warren Sloan is a 38 year old male who presents today for complete physical.  Immunizations: -Tetanus: Completed in 2016 -Influenza: Due this season   He endorses a reliavely healthy diet. He gets plenty of vegetables, fruit, whole grains, lean protein. He is limiting take out food to once weekly. He drinks mainly water, some diet soda.  Exercise: He is exercising 4-6 days weekly. Does a combination of cardio and weights.   Eye exam: Completed in 2018 Dental exam: Completes semi-annually     Observations/Objective:  Alert and oriented. Appears well, not sickly. No distress. Speaking in complete sentences.   Assessment and Plan:  See problem based charting.  Follow Up Instructions:  Continue exercising. You should be getting 150 minutes of moderate intensity exercise weekly.  Continue to work on a healthy diet.  Ensure you are consuming 64 ounces of water daily.  Please have your employer fax your labs once completed.   It was a pleasure to see you today! Warren Bossier, NP-C     I discussed the assessment and treatment plan with the patient. The patient was provided an opportunity to ask questions and all were answered. The patient agreed with the plan and demonstrated an understanding of the instructions.   The patient was advised to call back or seek an in-person evaluation if the symptoms  worsen or if the condition fails to improve as anticipated.     [name], NP    Review of Systems  Constitutional: Negative for unexpected weight change.  HENT: Negative for rhinorrhea.   Respiratory: Negative for cough and shortness of breath.   Cardiovascular: Negative for chest pain.  Gastrointestinal: Negative for constipation and diarrhea.  Genitourinary: Negative for difficulty urinating.  Musculoskeletal: Negative for arthralgias and myalgias.  Skin: Negative for rash.  Allergic/Immunologic: Negative for environmental allergies.  Neurological: Negative for dizziness, numbness and headaches.  Psychiatric/Behavioral: The patient is not nervous/anxious.        Past Medical History:  Diagnosis Date  . Chest pain   . Chronic ischemic heart disease    2D ECHO, 07/11/2003 - EF 59%, normal  . Myocardial bridge   . Shortness of breath    MET TEST, 09/29/2011     Social History   Socioeconomic History  . Marital status: Married    Spouse name: Not on file  . Number of children: Not on file  . Years of education: Not on file  . Highest education level: Not on file  Occupational History  . Not on file  Social Needs  . Financial resource strain: Not on file  . Food insecurity:    Worry: Not on file    Inability: Not on file  . Transportation needs:    Medical: Not on file    Non-medical: Not on file  Tobacco Use  . Smoking status: Never Smoker  . Smokeless tobacco: Never Used  Substance  and Sexual Activity  . Alcohol use: Yes    Alcohol/week: 0.0 standard drinks    Comment: Occasionally  . Drug use: No  . Sexual activity: Yes  Lifestyle  . Physical activity:    Days per week: Not on file    Minutes per session: Not on file  . Stress: Not on file  Relationships  . Social connections:    Talks on phone: Not on file    Gets together: Not on file    Attends religious service: Not on file    Active member of club or organization: Not on file     Attends meetings of clubs or organizations: Not on file    Relationship status: Not on file  . Intimate partner violence:    Fear of current or ex partner: Not on file    Emotionally abused: Not on file    Physically abused: Not on file    Forced sexual activity: Not on file  Other Topics Concern  . Not on file  Social History Narrative  . Not on file    Past Surgical History:  Procedure Laterality Date  . 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 wisdom teeth      Family History  Problem Relation Age of Onset  . CVA Maternal Grandmother 74  . Irritable bowel syndrome Paternal Grandmother   . Hypertension Paternal Grandfather   . Cancer Neg Hx     No Known Allergies  Current Outpatient Medications on File Prior to Visit  Medication Sig Dispense Refill  . amLODipine (NORVASC) 2.5 MG tablet TAKE 1 TABLET BY MOUTH ONCE DAILY 90 tablet 1  . citalopram (CELEXA) 20 MG tablet Take 1 tablet (20 mg total) by mouth daily. DUE FOR A PHYSICAL IN APRIL 90 tablet 0  . metoprolol succinate (TOPROL-XL) 25 MG 24 hr tablet Take 0.5 tablets (12.5 mg total) by mouth daily. NEED APPOINTMENT FOR ANY MORE REFILLS 45 tablet 0   No current facility-administered medications on file prior to visit.     Wt 186 lb (84.4 kg)   BMI 25.23 kg/m    Objective:   Physical Exam  Constitutional: He is oriented to person, place, and time. He appears well-nourished.  HENT:  Head: Normocephalic.  Neck: Neck supple.  Respiratory: Effort normal.  Musculoskeletal: Normal range of motion.  Neurological: He is alert and oriented to person, place, and time.  Skin: Skin is dry.  Psychiatric: He has a normal mood and affect.           Assessment & Plan:

## 2018-05-27 NOTE — Patient Instructions (Signed)
Continue exercising. You should be getting 150 minutes of moderate intensity exercise weekly.  Continue to work on a healthy diet.  Ensure you are consuming 64 ounces of water daily.  Please have your employer fax your labs once completed.   It was a pleasure to see you today! Mayra Reel, NP-C

## 2018-05-27 NOTE — Assessment & Plan Note (Signed)
Doing well on amlodipine and metoprolol succinate.  Continue same. Asymptomatic.

## 2018-05-27 NOTE — Assessment & Plan Note (Signed)
Doing well on citalopram 20 mg, denies SI/HI. Continue same.

## 2018-05-27 NOTE — Assessment & Plan Note (Signed)
Immunizations UTD. Commended him on a healthy lifestyle.  Virtual exam unremarkable. He will have labs done within the next several months per occupation. He will send a copy. Follow up in 1 year.

## 2018-06-15 ENCOUNTER — Other Ambulatory Visit: Payer: Self-pay | Admitting: Primary Care

## 2018-06-15 DIAGNOSIS — F39 Unspecified mood [affective] disorder: Secondary | ICD-10-CM

## 2018-08-04 ENCOUNTER — Other Ambulatory Visit: Payer: Self-pay | Admitting: Primary Care

## 2018-08-04 DIAGNOSIS — I201 Angina pectoris with documented spasm: Secondary | ICD-10-CM

## 2018-08-04 DIAGNOSIS — Q245 Malformation of coronary vessels: Secondary | ICD-10-CM

## 2018-08-08 ENCOUNTER — Other Ambulatory Visit: Payer: Self-pay | Admitting: Primary Care

## 2018-08-08 DIAGNOSIS — Q245 Malformation of coronary vessels: Secondary | ICD-10-CM

## 2018-09-05 ENCOUNTER — Other Ambulatory Visit: Payer: Self-pay | Admitting: Primary Care

## 2018-09-05 DIAGNOSIS — I201 Angina pectoris with documented spasm: Secondary | ICD-10-CM

## 2018-09-05 DIAGNOSIS — Q245 Malformation of coronary vessels: Secondary | ICD-10-CM

## 2018-10-11 ENCOUNTER — Other Ambulatory Visit: Payer: Self-pay

## 2018-10-11 DIAGNOSIS — Z20822 Contact with and (suspected) exposure to covid-19: Secondary | ICD-10-CM

## 2018-10-13 LAB — NOVEL CORONAVIRUS, NAA: SARS-CoV-2, NAA: NOT DETECTED

## 2019-01-27 ENCOUNTER — Other Ambulatory Visit: Payer: Self-pay | Admitting: Primary Care

## 2019-01-27 DIAGNOSIS — F39 Unspecified mood [affective] disorder: Secondary | ICD-10-CM

## 2019-05-04 ENCOUNTER — Other Ambulatory Visit: Payer: Self-pay | Admitting: Primary Care

## 2019-05-04 DIAGNOSIS — F39 Unspecified mood [affective] disorder: Secondary | ICD-10-CM

## 2019-05-16 ENCOUNTER — Other Ambulatory Visit: Payer: Self-pay | Admitting: Primary Care

## 2019-05-16 DIAGNOSIS — Q245 Malformation of coronary vessels: Secondary | ICD-10-CM

## 2019-05-16 DIAGNOSIS — I201 Angina pectoris with documented spasm: Secondary | ICD-10-CM

## 2019-05-29 ENCOUNTER — Other Ambulatory Visit: Payer: Self-pay | Admitting: Primary Care

## 2019-05-29 DIAGNOSIS — Q245 Malformation of coronary vessels: Secondary | ICD-10-CM

## 2019-05-31 ENCOUNTER — Other Ambulatory Visit: Payer: Self-pay | Admitting: Primary Care

## 2019-05-31 DIAGNOSIS — Z0000 Encounter for general adult medical examination without abnormal findings: Secondary | ICD-10-CM

## 2019-06-08 ENCOUNTER — Other Ambulatory Visit: Payer: Self-pay

## 2019-06-08 ENCOUNTER — Ambulatory Visit: Payer: Self-pay | Admitting: *Deleted

## 2019-06-08 DIAGNOSIS — Z0000 Encounter for general adult medical examination without abnormal findings: Secondary | ICD-10-CM

## 2019-06-08 NOTE — Progress Notes (Signed)
Fasting labs ahead of CPE with pcp next week.  °

## 2019-06-09 LAB — CMP12+LP+TP+TSH+6AC+PSA+CBC…
ALT: 27 IU/L (ref 0–44)
AST: 24 IU/L (ref 0–40)
Albumin/Globulin Ratio: 1.9 (ref 1.2–2.2)
Albumin: 4.5 g/dL (ref 4.0–5.0)
Alkaline Phosphatase: 77 IU/L (ref 48–121)
BUN/Creatinine Ratio: 9 (ref 9–20)
BUN: 9 mg/dL (ref 6–24)
Basophils Absolute: 0 10*3/uL (ref 0.0–0.2)
Basos: 1 %
Bilirubin Total: 0.6 mg/dL (ref 0.0–1.2)
Calcium: 9.2 mg/dL (ref 8.7–10.2)
Chloride: 103 mmol/L (ref 96–106)
Chol/HDL Ratio: 2.2 ratio (ref 0.0–5.0)
Cholesterol, Total: 166 mg/dL (ref 100–199)
Creatinine, Ser: 0.97 mg/dL (ref 0.76–1.27)
EOS (ABSOLUTE): 0 10*3/uL (ref 0.0–0.4)
Eos: 1 %
Estimated CHD Risk: <0.5 times avg. (ref 0.0–1.0)
Free Thyroxine Index: 1.8 (ref 1.2–4.9)
GFR calc Af Amer: 112 mL/min/{1.73_m2} (ref >59)
GFR calc non Af Amer: 97 mL/min/{1.73_m2} (ref >59)
GGT: 49 IU/L (ref 0–65)
Globulin, Total: 2.4 g/dL (ref 1.5–4.5)
Glucose: 82 mg/dL (ref 65–99)
HDL: 75 mg/dL (ref >39)
Hematocrit: 41.8 % (ref 37.5–51.0)
Hemoglobin: 13.6 g/dL (ref 13.0–17.7)
Immature Grans (Abs): 0 10*3/uL (ref 0.0–0.1)
Immature Granulocytes: 0 %
Iron: 139 ug/dL (ref 38–169)
LDH: 146 IU/L (ref 121–224)
LDL Chol Calc (NIH): 82 mg/dL (ref 0–99)
Lymphocytes Absolute: 1.8 10*3/uL (ref 0.7–3.1)
Lymphs: 45 %
MCH: 30.9 pg (ref 26.6–33.0)
MCHC: 32.5 g/dL (ref 31.5–35.7)
MCV: 95 fL (ref 79–97)
Monocytes Absolute: 0.4 10*3/uL (ref 0.1–0.9)
Monocytes: 9 %
Neutrophils Absolute: 1.7 10*3/uL (ref 1.4–7.0)
Neutrophils: 44 %
Phosphorus: 2.4 mg/dL — ABNORMAL LOW (ref 2.8–4.1)
Platelets: 234 10*3/uL (ref 150–450)
Potassium: 4.5 mmol/L (ref 3.5–5.2)
Prostate Specific Ag, Serum: 0.7 ng/mL (ref 0.0–4.0)
RBC: 4.4 x10E6/uL (ref 4.14–5.80)
RDW: 12.8 % (ref 11.6–15.4)
Sodium: 139 mmol/L (ref 134–144)
T3 Uptake Ratio: 27 % (ref 24–39)
T4, Total: 6.8 ug/dL (ref 4.5–12.0)
TSH: 1.24 u[IU]/mL (ref 0.450–4.500)
Total Protein: 6.9 g/dL (ref 6.0–8.5)
Triglycerides: 43 mg/dL (ref 0–149)
Uric Acid: 5.6 mg/dL (ref 3.8–8.4)
VLDL Cholesterol Cal: 9 mg/dL (ref 5–40)
WBC: 4 10*3/uL (ref 3.4–10.8)

## 2019-06-09 LAB — HGB A1C W/O EAG: Hgb A1c MFr Bld: 5.3 % (ref 4.8–5.6)

## 2019-06-09 NOTE — Progress Notes (Signed)
Noted and agree. 

## 2019-06-12 ENCOUNTER — Other Ambulatory Visit: Payer: PRIVATE HEALTH INSURANCE

## 2019-06-19 ENCOUNTER — Encounter: Payer: Self-pay | Admitting: Primary Care

## 2019-06-19 ENCOUNTER — Ambulatory Visit (INDEPENDENT_AMBULATORY_CARE_PROVIDER_SITE_OTHER): Payer: PRIVATE HEALTH INSURANCE | Admitting: Primary Care

## 2019-06-19 ENCOUNTER — Other Ambulatory Visit: Payer: Self-pay

## 2019-06-19 VITALS — BP 114/72 | HR 60 | Temp 97.2°F | Ht 72.0 in | Wt 194.5 lb

## 2019-06-19 DIAGNOSIS — F39 Unspecified mood [affective] disorder: Secondary | ICD-10-CM | POA: Diagnosis not present

## 2019-06-19 DIAGNOSIS — I201 Angina pectoris with documented spasm: Secondary | ICD-10-CM

## 2019-06-19 DIAGNOSIS — Q245 Malformation of coronary vessels: Secondary | ICD-10-CM | POA: Diagnosis not present

## 2019-06-19 DIAGNOSIS — Z0000 Encounter for general adult medical examination without abnormal findings: Secondary | ICD-10-CM

## 2019-06-19 MED ORDER — CITALOPRAM HYDROBROMIDE 20 MG PO TABS: 20.0000 mg | ORAL_TABLET | Freq: Every day | ORAL | 3 refills | Status: DC

## 2019-06-19 MED ORDER — AMLODIPINE BESYLATE 2.5 MG PO TABS: 2.5000 mg | ORAL_TABLET | Freq: Every day | ORAL | 3 refills | Status: DC

## 2019-06-19 MED ORDER — METOPROLOL SUCCINATE ER 25 MG PO TB24: ORAL_TABLET | ORAL | 3 refills | Status: DC

## 2019-06-19 NOTE — Patient Instructions (Signed)
Continue exercising. You should be getting 150 minutes of moderate intensity exercise weekly.  Continue to work on a healthy diet. Ensure you are consuming 64 ounces of water daily.  It was a pleasure to see you today!   Preventive Care 18-40 Years Old, Male Preventive care refers to lifestyle choices and visits with your health care provider that can promote health and wellness. This includes:  A yearly physical exam. This is also called an annual well check.  Regular dental and eye exams.  Immunizations.  Screening for certain conditions.  Healthy lifestyle choices, such as eating a healthy diet, getting regular exercise, not using drugs or products that contain nicotine and tobacco, and limiting alcohol use. What can I expect for my preventive care visit? Physical exam Your health care provider will check:  Height and weight. These may be used to calculate body mass index (BMI), which is a measurement that tells if you are at a healthy weight.  Heart rate and blood pressure.  Your skin for abnormal spots. Counseling Your health care provider may ask you questions about:  Alcohol, tobacco, and drug use.  Emotional well-being.  Home and relationship well-being.  Sexual activity.  Eating habits.  Work and work Statistician. What immunizations do I need?  Influenza (flu) vaccine  This is recommended every year. Tetanus, diphtheria, and pertussis (Tdap) vaccine  You may need a Td booster every 10 years. Varicella (chickenpox) vaccine  You may need this vaccine if you have not already been vaccinated. Zoster (shingles) vaccine  You may need this after age 55. Measles, mumps, and rubella (MMR) vaccine  You may need at least one dose of MMR if you were born in 1957 or later. You may also need a second dose. Pneumococcal conjugate (PCV13) vaccine  You may need this if you have certain conditions and were not previously vaccinated. Pneumococcal polysaccharide  (PPSV23) vaccine  You may need one or two doses if you smoke cigarettes or if you have certain conditions. Meningococcal conjugate (MenACWY) vaccine  You may need this if you have certain conditions. Hepatitis A vaccine  You may need this if you have certain conditions or if you travel or work in places where you may be exposed to hepatitis A. Hepatitis B vaccine  You may need this if you have certain conditions or if you travel or work in places where you may be exposed to hepatitis B. Haemophilus influenzae type b (Hib) vaccine  You may need this if you have certain risk factors. Human papillomavirus (HPV) vaccine  If recommended by your health care provider, you may need three doses over 6 months. You may receive vaccines as individual doses or as more than one vaccine together in one shot (combination vaccines). Talk with your health care provider about the risks and benefits of combination vaccines. What tests do I need? Blood tests  Lipid and cholesterol levels. These may be checked every 5 years, or more frequently if you are over 76 years old.  Hepatitis C test.  Hepatitis B test. Screening  Lung cancer screening. You may have this screening every year starting at age 32 if you have a 30-pack-year history of smoking and currently smoke or have quit within the past 15 years.  Prostate cancer screening. Recommendations will vary depending on your family history and other risks.  Colorectal cancer screening. All adults should have this screening starting at age 32 and continuing until age 76. Your health care provider may recommend screening at age  if you are at increased risk. You will have tests every 1-10 years, depending on your results and the type of screening test.  Diabetes screening. This is done by checking your blood sugar (glucose) after you have not eaten for a while (fasting). You may have this done every 1-3 years.  Sexually transmitted disease (STD)  testing. Follow these instructions at home: Eating and drinking  Eat a diet that includes fresh fruits and vegetables, whole grains, lean protein, and low-fat dairy products.  Take vitamin and mineral supplements as recommended by your health care provider.  Do not drink alcohol if your health care provider tells you not to drink.  If you drink alcohol: ? Limit how much you have to 0-2 drinks a day. ? Be aware of how much alcohol is in your drink. In the [place] one drink equals one 12 oz bottle of beer (355 mL), one 5 oz glass of wine (148 mL), or one 1 oz glass of hard liquor (44 mL). Lifestyle  Take daily care of your teeth and gums.  Stay active. Exercise for at least 30 minutes on 5 or more days each week.  Do not use any products that contain nicotine or tobacco, such as cigarettes, e-cigarettes, and chewing tobacco. If you need help quitting, ask your health care provider.  If you are sexually active, practice safe sex. Use a condom or other form of protection to prevent STIs (sexually transmitted infections).  Talk with your health care provider about taking a low-dose aspirin every day starting at age 50. What's next?  Go to your health care provider once a year for a well check visit.  Ask your health care provider how often you should have your eyes and teeth checked.  Stay up to date on all vaccines. This information is not intended to replace advice given to you by your health care provider. Make sure you discuss any questions you have with your health care provider. Document Revised: 12/16/2017 Document Reviewed: 12/16/2017 Elsevier Patient Education  2020 Elsevier Inc.  

## 2019-06-19 NOTE — Assessment & Plan Note (Signed)
Asymptomatic. Compliant to metoprolol succinate and amlodipine, no longer follows with cardiology. Continue to monitor. Refills sent to pharmacy.

## 2019-06-19 NOTE — Assessment & Plan Note (Signed)
Doing well on citalopram 20 mg, continue same.  Refills sent to pharmacy  

## 2019-06-19 NOTE — Assessment & Plan Note (Signed)
Immunizations UTD. Commended him on a healthy diet and regular exercise.  Exam today unremarkable. Labs reviewed.

## 2019-06-19 NOTE — Progress Notes (Signed)
Subjective:    Patient ID: Warren Sloan, male    DOB: [birth year], 39 y.o.   MRN: [id number]  HPI  This visit occurred during the SARS-CoV-2 public health emergency.  Safety protocols were in place, including screening questions prior to the visit, additional usage of staff PPE, and extensive cleaning of exam room while observing appropriate contact time as indicated for disinfecting solutions.   Warren Sloan is a 39 year old male who presents today for complete physical and medication refills.   Immunizations: -Tetanus: Completed in 2016 -Influenza: Completed last season  -Covid-19: Completed  Diet: He endorses a healthy diet.  Exercise: He is exercising 4-5 times weekly.   Eye exam: No recent exam Dental exam: Completes semi-annually   BP Readings from Last 3 Encounters:  06/19/19 114/72  06/15/17 116/70  05/04/17 118/72      Review of Systems  Constitutional: Negative for unexpected weight change.  HENT: Negative for rhinorrhea.   Respiratory: Negative for cough and shortness of breath.   Cardiovascular: Negative for chest pain.  Gastrointestinal: Negative for constipation and diarrhea.  Genitourinary: Negative for difficulty urinating.  Musculoskeletal: Negative for arthralgias and myalgias.  Skin: Negative for rash.  Allergic/Immunologic: Positive for environmental allergies.  Neurological: Negative for dizziness, numbness and headaches.  Psychiatric/Behavioral: The patient is not nervous/anxious.        Past Medical History:  Diagnosis Date   Chest pain    Chronic ischemic heart disease    2D ECHO, 07/11/2003 - EF 59%, normal   Myocardial bridge    Shortness of breath    MET TEST, 09/29/2011     Social History   Socioeconomic History   Marital status: Married    Spouse name: Not on file   Number of children: Not on file   Years of education: Not on file   Highest education level: Not on file  Occupational History   Not on file  Tobacco  Use   Smoking status: Never Smoker   Smokeless tobacco: Never Used  Substance and Sexual Activity   Alcohol use: Yes    Alcohol/week: 0.0 standard drinks    Comment: Occasionally   Drug use: No   Sexual activity: Yes  Other Topics Concern   Not on file  Social History Narrative   Not on file   Social Determinants of Health   Financial Resource Strain:    Difficulty of Paying Living Expenses:   Food Insecurity:    Worried About Charity fundraiser in the Last Year:    Arboriculturist in the Last Year:   Transportation Needs:    Film/video editor (Medical):    Lack of Transportation (Non-Medical):   Physical Activity:    Days of Exercise per Week:    Minutes of Exercise per Session:   Stress:    Feeling of Stress :   Social Connections:    Frequency of Communication with Friends and Family:    Frequency of Social Gatherings with Friends and Family:    Attends Religious Services:    Active Member of Clubs or Organizations:    Attends Archivist Meetings:    Marital Status:   Intimate Partner Violence:    Fear of Current or Ex-Partner:    Emotionally Abused:    Physically Abused:    Sexually Abused:     Past Surgical History:  Procedure Laterality Date   CARDIAC CATHETERIZATION  04/30/2005   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 NEED APPOINTMENT FOR ANY MORE REFILLS 30 tablet 0   citalopram (CELEXA) 20 MG tablet Take 1 tablet by mouth once daily 90 tablet 0   metoprolol succinate  (TOPROL-XL) 25 MG 24 hr tablet Take 1/2 (one-half) tablet by mouth once daily for blood pressure NEED APPOINTMENT FOR ANY MORE REFILLS 45 tablet 0   No current facility-administered medications on file prior to visit.    BP 114/72    Pulse 60    Temp (!) 97.2 F (36.2 C) (Temporal)    Ht 6' (1.829 m)    Wt 194 lb 8 oz (88.2 kg)    SpO2 97%    BMI 26.38 kg/m    Objective:   Physical Exam  Constitutional: He is oriented to person, place, and time.  HENT:  Right Ear: Tympanic membrane and ear canal normal.  Left Ear: Tympanic membrane and ear canal normal.  Eyes: Pupils are equal, round, and reactive to light.  Cardiovascular: Normal rate and regular rhythm.  Respiratory: Effort normal and breath sounds normal.  GI: Soft. Bowel sounds are normal. There is no abdominal tenderness.  Musculoskeletal:        General: Normal range of motion.     Cervical back: Neck supple.  Neurological: He is alert and oriented to person, place, and time. No cranial nerve deficit.  Reflex Scores:      Patellar reflexes are 2+ on the right side and 2+ on the left side. Skin: Skin is warm and dry.  Psychiatric: Mood normal.           Assessment & Plan:

## 2019-08-09 ENCOUNTER — Other Ambulatory Visit: Payer: Self-pay | Admitting: Primary Care

## 2019-08-09 DIAGNOSIS — F39 Unspecified mood [affective] disorder: Secondary | ICD-10-CM

## 2019-09-14 ENCOUNTER — Other Ambulatory Visit: Payer: Self-pay | Admitting: Primary Care

## 2019-09-14 DIAGNOSIS — Q245 Malformation of coronary vessels: Secondary | ICD-10-CM

## 2019-09-14 DIAGNOSIS — I201 Angina pectoris with documented spasm: Secondary | ICD-10-CM

## 2020-03-22 ENCOUNTER — Other Ambulatory Visit: Payer: Self-pay

## 2020-03-22 ENCOUNTER — Ambulatory Visit: Payer: Self-pay | Admitting: *Deleted

## 2020-03-22 VITALS — BP 115/74 | HR 52 | Ht 72.5 in | Wt 184.0 lb

## 2020-03-22 DIAGNOSIS — Z0000 Encounter for general adult medical examination without abnormal findings: Secondary | ICD-10-CM

## 2020-03-22 NOTE — Progress Notes (Signed)
[employer] insurance premium discount evaluation: Labs Drawn. Replacements ROI form signed. Tobacco Free Attestation form signed.  Forms placed in paper chart.  

## 2020-03-23 LAB — CMP12+LP+TP+TSH+6AC+PSA+CBC…
ALT: 33 IU/L (ref 0–44)
AST: 32 IU/L (ref 0–40)
Albumin/Globulin Ratio: 2 (ref 1.2–2.2)
Albumin: 4.5 g/dL (ref 4.0–5.0)
Alkaline Phosphatase: 86 IU/L (ref 44–121)
BUN/Creatinine Ratio: 13 (ref 9–20)
BUN: 15 mg/dL (ref 6–24)
Basophils Absolute: 0 10*3/uL (ref 0.0–0.2)
Basos: 1 %
Bilirubin Total: 0.3 mg/dL (ref 0.0–1.2)
Calcium: 9.2 mg/dL (ref 8.7–10.2)
Chloride: 102 mmol/L (ref 96–106)
Chol/HDL Ratio: 2.5 ratio (ref 0.0–5.0)
Cholesterol, Total: 174 mg/dL (ref 100–199)
Creatinine, Ser: 1.17 mg/dL (ref 0.76–1.27)
EOS (ABSOLUTE): 0.1 10*3/uL (ref 0.0–0.4)
Eos: 2 %
Estimated CHD Risk: <0.5 times avg. (ref 0.0–1.0)
Free Thyroxine Index: 2.2 (ref 1.2–4.9)
GGT: 46 IU/L (ref 0–65)
Globulin, Total: 2.3 g/dL (ref 1.5–4.5)
Glucose: 80 mg/dL (ref 65–99)
HDL: 69 mg/dL (ref >39)
Hematocrit: 40.2 % (ref 37.5–51.0)
Hemoglobin: 14.2 g/dL (ref 13.0–17.7)
Immature Grans (Abs): 0 10*3/uL (ref 0.0–0.1)
Immature Granulocytes: 0 %
Iron: 62 ug/dL (ref 38–169)
LDH: 159 IU/L (ref 121–224)
LDL Chol Calc (NIH): 97 mg/dL (ref 0–99)
Lymphocytes Absolute: 1.6 10*3/uL (ref 0.7–3.1)
Lymphs: 44 %
MCH: 33.3 pg — ABNORMAL HIGH (ref 26.6–33.0)
MCHC: 35.3 g/dL (ref 31.5–35.7)
MCV: 94 fL (ref 79–97)
Monocytes Absolute: 0.3 10*3/uL (ref 0.1–0.9)
Monocytes: 9 %
Neutrophils Absolute: 1.6 10*3/uL (ref 1.4–7.0)
Neutrophils: 44 %
Phosphorus: 3.5 mg/dL (ref 2.8–4.1)
Platelets: 227 10*3/uL (ref 150–450)
Potassium: 4.4 mmol/L (ref 3.5–5.2)
Prostate Specific Ag, Serum: 0.7 ng/mL (ref 0.0–4.0)
RBC: 4.27 x10E6/uL (ref 4.14–5.80)
RDW: 12.8 % (ref 11.6–15.4)
Sodium: 139 mmol/L (ref 134–144)
T3 Uptake Ratio: 28 % (ref 24–39)
T4, Total: 7.8 ug/dL (ref 4.5–12.0)
TSH: 1.49 u[IU]/mL (ref 0.450–4.500)
Total Protein: 6.8 g/dL (ref 6.0–8.5)
Triglycerides: 40 mg/dL (ref 0–149)
Uric Acid: 4.2 mg/dL (ref 3.8–8.4)
VLDL Cholesterol Cal: 8 mg/dL (ref 5–40)
WBC: 3.6 10*3/uL (ref 3.4–10.8)
eGFR: 80 mL/min/{1.73_m2} (ref >59)

## 2020-03-23 LAB — HGB A1C W/O EAG: Hgb A1c MFr Bld: 5.4 % (ref 4.8–5.6)

## 2020-03-25 NOTE — Progress Notes (Signed)
RN Rolly Salter attempted to reach patient to review results no answer

## 2020-03-25 NOTE — Progress Notes (Signed)
Pt has not yet reviewed MyChart notes. Called pt to review by phone. No answer. LVM noting all results normal, routine f/u with pcp, and to contact clinic with any questions or concerns.

## 2020-07-04 ENCOUNTER — Other Ambulatory Visit: Payer: Self-pay | Admitting: Primary Care

## 2020-07-04 DIAGNOSIS — Q245 Malformation of coronary vessels: Secondary | ICD-10-CM

## 2020-08-08 ENCOUNTER — Other Ambulatory Visit: Payer: Self-pay | Admitting: Primary Care

## 2020-08-08 DIAGNOSIS — F39 Unspecified mood [affective] disorder: Secondary | ICD-10-CM

## 2020-08-16 ENCOUNTER — Other Ambulatory Visit: Payer: Self-pay | Admitting: Primary Care

## 2020-08-16 DIAGNOSIS — Q245 Malformation of coronary vessels: Secondary | ICD-10-CM

## 2020-08-19 NOTE — Telephone Encounter (Signed)
Patient needs follow up.was documented on last refill. Called l/m to call office for CPE

## 2020-08-20 NOTE — Telephone Encounter (Signed)
Patient has called office and made app for CPE. Refill sent in .

## 2020-09-04 ENCOUNTER — Ambulatory Visit (INDEPENDENT_AMBULATORY_CARE_PROVIDER_SITE_OTHER): Payer: No Typology Code available for payment source | Admitting: Primary Care

## 2020-09-04 ENCOUNTER — Encounter: Payer: Self-pay | Admitting: Primary Care

## 2020-09-04 ENCOUNTER — Other Ambulatory Visit: Payer: Self-pay

## 2020-09-04 DIAGNOSIS — I201 Angina pectoris with documented spasm: Secondary | ICD-10-CM

## 2020-09-04 DIAGNOSIS — F39 Unspecified mood [affective] disorder: Secondary | ICD-10-CM

## 2020-09-04 DIAGNOSIS — Z0000 Encounter for general adult medical examination without abnormal findings: Secondary | ICD-10-CM | POA: Diagnosis not present

## 2020-09-04 DIAGNOSIS — Q245 Malformation of coronary vessels: Secondary | ICD-10-CM

## 2020-09-04 DIAGNOSIS — Z8679 Personal history of other diseases of the circulatory system: Secondary | ICD-10-CM

## 2020-09-04 MED ORDER — CITALOPRAM HYDROBROMIDE 20 MG PO TABS: 20.0000 mg | ORAL_TABLET | Freq: Every day | ORAL | 3 refills | Status: DC

## 2020-09-04 MED ORDER — METOPROLOL SUCCINATE ER 25 MG PO TB24: ORAL_TABLET | ORAL | 3 refills | Status: DC

## 2020-09-04 MED ORDER — AMLODIPINE BESYLATE 2.5 MG PO TABS: 2.5000 mg | ORAL_TABLET | Freq: Every day | ORAL | 3 refills | Status: DC

## 2020-09-04 NOTE — Progress Notes (Signed)
Subjective:    Patient ID: Warren Sloan, male    DOB: [birth year], 40 y.o.   MRN: [id number]  HPI  Warren Sloan is a very pleasant 40 y.o. male who presents today for complete physical and follow up of chronic conditions.  Immunizations: -Tetanus: 2016 -Influenza: declines  -Covid-19: J&J and Moderna  Diet: Fair diet.  Exercise: Regular exercise several days weekly  Eye exam: Completes annually  Dental exam: Completes semi-annually   BP Readings from Last 3 Encounters:  09/04/20 108/72  03/22/20 115/74  06/19/19 114/72        Review of Systems  Constitutional:  Negative for unexpected weight change.  HENT:  Negative for rhinorrhea.   Respiratory:  Negative for shortness of breath.   Cardiovascular:  Negative for chest pain.  Gastrointestinal:  Negative for constipation and diarrhea.  Genitourinary:  Negative for difficulty urinating.  Musculoskeletal:  Negative for arthralgias and myalgias.  Skin:  Negative for rash.  Allergic/Immunologic: Positive for environmental allergies.  Neurological:  Negative for dizziness and headaches.  Psychiatric/Behavioral:  The patient is not nervous/anxious.         Past Medical History:  Diagnosis Date   Chest pain    Chronic ischemic heart disease    2D ECHO, 07/11/2003 - EF 59%, normal   Myocardial bridge    Shortness of breath    MET TEST, 09/29/2011    Social History   Socioeconomic History   Marital status: Married    Spouse name: Not on file   Number of children: Not on file   Years of education: Not on file   Highest education level: Not on file  Occupational History   Not on file  Tobacco Use   Smoking status: Never   Smokeless tobacco: Never  Substance and Sexual Activity   Alcohol use: Yes    Alcohol/week: 0.0 standard drinks    Comment: Occasionally   Drug use: No   Sexual activity: Yes  Other Topics Concern   Not on file  Social History Narrative   Not on file   Social Determinants of  Health   Financial Resource Strain: Not on file  Food Insecurity: Not on file  Transportation Needs: Not on file  Physical Activity: Not on file  Stress: Not on file  Social Connections: Not on file  Intimate Partner Violence: Not on file    Past Surgical History:  Procedure Laterality Date   CARDIAC CATHETERIZATION  04/30/2005   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 For anxiety. 30 tablet 0   metoprolol succinate (TOPROL-XL) 25 MG 24 hr tablet TAKE 1/2 (ONE-HALF) TABLET BY MOUTH ONCE DAILY 45 tablet 6   No current facility-administered medications on file prior to visit.    BP 108/72   Pulse 76   Temp 98.7 F (37.1 C) (Temporal)   Ht 6' 0.5" (1.842 m)   Wt 188 lb (85.3  kg)   SpO2 99%   BMI 25.15 kg/m  Objective:   Physical Exam HENT:     Right Ear: Tympanic membrane and ear canal normal.     Left Ear: Tympanic membrane and ear canal normal.     Nose: Nose normal.     Right Sinus: No maxillary sinus tenderness or frontal sinus tenderness.     Left Sinus: No maxillary sinus tenderness or frontal sinus tenderness.  Eyes:     Conjunctiva/sclera: Conjunctivae normal.  Neck:     Thyroid: No thyromegaly.     Vascular: No carotid bruit.  Cardiovascular:     Rate and Rhythm: Normal rate and regular rhythm.     Heart sounds: Normal heart sounds.  Pulmonary:     Effort: Pulmonary effort is normal.     Breath sounds: Normal breath sounds. No wheezing or rales.   Abdominal:     General: Bowel sounds are normal.     Palpations: Abdomen is soft.     Tenderness: There is no abdominal tenderness.  Musculoskeletal:        General: Normal range of motion.     Cervical back: Neck supple.  Skin:    General: Skin is warm and dry.  Neurological:     Mental Status: He is alert and oriented to person, place, and time.     Cranial Nerves: No cranial nerve deficit.     Deep Tendon Reflexes: Reflexes are normal and symmetric.  Psychiatric:        Mood and Affect: Mood normal.          Assessment & Plan:      This visit occurred during the SARS-CoV-2 public health emergency.  Safety protocols were in place, including screening questions prior to the visit, additional usage of staff PPE, and extensive cleaning of exam room while observing appropriate contact time as indicated for disinfecting solutions.

## 2020-09-04 NOTE — Assessment & Plan Note (Signed)
Well controlled on citalopram 20 mg, continue same.

## 2020-09-04 NOTE — Assessment & Plan Note (Signed)
Doing well on metoprolol succinate XL 12.5 mg daily and amlodipine 2.5 mg daily. Continue same.

## 2020-09-04 NOTE — Assessment & Plan Note (Signed)
Stable, no longer following with cardiology. Continue metoprolol succinate 12.5 mg and amlodipine 2.5 mg daily.

## 2020-09-04 NOTE — Assessment & Plan Note (Signed)
Immunizations UTD.  Commended him on a healthy diet, regular exercise. Exam today stable. Labs reviewed from March 2022.

## 2020-09-04 NOTE — Patient Instructions (Signed)
It was a pleasure to see you today!  Preventive Care 40-41 Years Old, Male Preventive care refers to lifestyle choices and visits with your health care provider that can promote health and wellness. This includes: A yearly physical exam. This is also called an annual wellness visit. Regular dental and eye exams. Immunizations. Screening for certain conditions. Healthy lifestyle choices, such as: Eating a healthy diet. Getting regular exercise. Not using drugs or products that contain nicotine and tobacco. Limiting alcohol use. What can I expect for my preventive care visit? Physical exam Your health care provider will check your: Height and weight. These may be used to calculate your BMI (body mass index). BMI is a measurement that tells if you are at a healthy weight. Heart rate and blood pressure. Body temperature. Skin for abnormal spots. Counseling Your health care provider may ask you questions about your: Past medical problems. Family's medical history. Alcohol, tobacco, and drug use. Emotional well-being. Home life and relationship well-being. Sexual activity. Diet, exercise, and sleep habits. Work and work environment. Access to firearms. What immunizations do I need? Vaccines are usually given at various ages, according to a schedule. Your health care provider will recommend vaccines for you based on your age, medical history, and lifestyle or other factors, such as travel or where you work. What tests do I need? Blood tests Lipid and cholesterol levels. These may be checked every 5 years, or more often if you are over 50 years old. Hepatitis C test. Hepatitis B test. Screening Lung cancer screening. You may have this screening every year starting at age 55 if you have a 30-pack-year history of smoking and currently smoke or have quit within the past 15 years. Prostate cancer screening. Recommendations will vary depending on your family history and other  risks. Genital exam to check for testicular cancer or hernias. Colorectal cancer screening. All adults should have this screening starting at age 50 and continuing until age 75. Your health care provider may recommend screening at age 45 if you are at increased risk. You will have tests every 1-10 years, depending on your results and the type of screening test. Diabetes screening. This is done by checking your blood sugar (glucose) after you have not eaten for a while (fasting). You may have this done every 1-3 years. STD (sexually transmitted disease) testing, if you are at risk. Follow these instructions at home: Eating and drinking  Eat a diet that includes fresh fruits and vegetables, whole grains, lean protein, and low-fat dairy products. Take vitamin and mineral supplements as recommended by your health care provider. Do not drink alcohol if your health care provider tells you not to drink. If you drink alcohol: Limit how much you have to 0-2 drinks a day. Be aware of how much alcohol is in your drink. In the [place] one drink equals one 12 oz bottle of beer (355 mL), one 5 oz glass of wine (148 mL), or one 1 oz glass of hard liquor (44 mL). Lifestyle Take daily care of your teeth and gums. Brush your teeth every morning and night with fluoride toothpaste. Floss one time each day. Stay active. Exercise for at least 30 minutes 5 or more days each week. Do not use any products that contain nicotine or tobacco, such as cigarettes, e-cigarettes, and chewing tobacco. If you need help quitting, ask your health care provider. Do not use drugs. If you are sexually active, practice safe sex. Use a condom or other form of protection to   prevent STIs (sexually transmitted infections). If told by your health care provider, take low-dose aspirin daily starting at age 50. Find healthy ways to cope with stress, such as: Meditation, yoga, or listening to music. Journaling. Talking to a trusted  person. Spending time with friends and family. Safety Always wear your seat belt while driving or riding in a vehicle. Do not drive: If you have been drinking alcohol. Do not ride with someone who has been drinking. When you are tired or distracted. While texting. Wear a helmet and other protective equipment during sports activities. If you have firearms in your house, make sure you follow all gun safety procedures. What's next? Go to your health care provider once a year for an annual wellness visit. Ask your health care provider how often you should have your eyes and teeth checked. Stay up to date on all vaccines. This information is not intended to replace advice given to you by your health care provider. Make sure you discuss any questions you have with your health care provider. Document Revised: 03/01/2020 Document Reviewed: 12/16/2017 Elsevier Patient Education  2022 Elsevier Inc.  

## 2020-09-20 ENCOUNTER — Encounter: Payer: PRIVATE HEALTH INSURANCE | Admitting: Primary Care

## 2020-09-26 ENCOUNTER — Encounter: Payer: PRIVATE HEALTH INSURANCE | Admitting: Primary Care

## 2021-05-01 ENCOUNTER — Other Ambulatory Visit: Payer: Self-pay | Admitting: Registered Nurse

## 2021-05-01 ENCOUNTER — Ambulatory Visit: Payer: Self-pay | Admitting: *Deleted

## 2021-05-01 VITALS — BP 110/71 | HR 51 | Ht 73.0 in | Wt 188.0 lb

## 2021-05-01 DIAGNOSIS — Z0000 Encounter for general adult medical examination without abnormal findings: Secondary | ICD-10-CM

## 2021-05-01 NOTE — Progress Notes (Signed)
[employer] insurance premium discount evaluation: Labs Drawn. Replacements ROI form signed. Tobacco Free Attestation form signed.  Forms placed in paper chart.  

## 2021-05-02 ENCOUNTER — Telehealth: Payer: Self-pay | Admitting: Registered Nurse

## 2021-05-02 ENCOUNTER — Encounter: Payer: Self-pay | Admitting: Registered Nurse

## 2021-05-02 DIAGNOSIS — E559 Vitamin D deficiency, unspecified: Secondary | ICD-10-CM

## 2021-05-02 LAB — CMP12+LP+TP+TSH+6AC+CBC/D/PLT
ALT: 26 IU/L (ref 0–44)
AST: 26 IU/L (ref 0–40)
Albumin/Globulin Ratio: 2 (ref 1.2–2.2)
Albumin: 4.7 g/dL (ref 4.0–5.0)
Alkaline Phosphatase: 71 IU/L (ref 44–121)
BUN/Creatinine Ratio: 10 (ref 9–20)
BUN: 12 mg/dL (ref 6–24)
Basophils Absolute: 0.1 10*3/uL (ref 0.0–0.2)
Basos: 1 %
Bilirubin Total: 0.6 mg/dL (ref 0.0–1.2)
Calcium: 9.4 mg/dL (ref 8.7–10.2)
Chloride: 100 mmol/L (ref 96–106)
Chol/HDL Ratio: 2.3 ratio (ref 0.0–5.0)
Cholesterol, Total: 177 mg/dL (ref 100–199)
Creatinine, Ser: 1.17 mg/dL (ref 0.76–1.27)
EOS (ABSOLUTE): 0.1 10*3/uL (ref 0.0–0.4)
Eos: 1 %
Estimated CHD Risk: <0.5 times avg. (ref 0.0–1.0)
Free Thyroxine Index: 2.1 (ref 1.2–4.9)
GGT: 41 IU/L (ref 0–65)
Globulin, Total: 2.4 g/dL (ref 1.5–4.5)
Glucose: 84 mg/dL (ref 70–99)
HDL: 78 mg/dL (ref >39)
Hematocrit: 41.7 % (ref 37.5–51.0)
Hemoglobin: 14 g/dL (ref 13.0–17.7)
Immature Grans (Abs): 0 10*3/uL (ref 0.0–0.1)
Immature Granulocytes: 0 %
Iron: 139 ug/dL (ref 38–169)
LDH: 138 IU/L (ref 121–224)
LDL Chol Calc (NIH): 89 mg/dL (ref 0–99)
Lymphocytes Absolute: 1.6 10*3/uL (ref 0.7–3.1)
Lymphs: 43 %
MCH: 31.6 pg (ref 26.6–33.0)
MCHC: 33.6 g/dL (ref 31.5–35.7)
MCV: 94 fL (ref 79–97)
Monocytes Absolute: 0.3 10*3/uL (ref 0.1–0.9)
Monocytes: 9 %
Neutrophils Absolute: 1.7 10*3/uL (ref 1.4–7.0)
Neutrophils: 46 %
Phosphorus: 3 mg/dL (ref 2.8–4.1)
Platelets: 236 10*3/uL (ref 150–450)
Potassium: 4.3 mmol/L (ref 3.5–5.2)
RBC: 4.43 x10E6/uL (ref 4.14–5.80)
RDW: 13 % (ref 11.6–15.4)
Sodium: 138 mmol/L (ref 134–144)
T3 Uptake Ratio: 26 % (ref 24–39)
T4, Total: 8 ug/dL (ref 4.5–12.0)
TSH: 1.31 u[IU]/mL (ref 0.450–4.500)
Total Protein: 7.1 g/dL (ref 6.0–8.5)
Triglycerides: 50 mg/dL (ref 0–149)
Uric Acid: 4.5 mg/dL (ref 3.8–8.4)
VLDL Cholesterol Cal: 10 mg/dL (ref 5–40)
WBC: 3.7 10*3/uL (ref 3.4–10.8)
eGFR: 80 mL/min/{1.73_m2} (ref >59)

## 2021-05-02 LAB — HGB A1C W/O EAG: Hgb A1c MFr Bld: 5.3 % (ref 4.8–5.6)

## 2021-05-02 LAB — VITAMIN D 25 HYDROXY (VIT D DEFICIENCY, FRACTURES): Vit D, 25-Hydroxy: 10.2 ng/mL — ABNORMAL LOW (ref 30.0–100.0)

## 2021-05-02 NOTE — Telephone Encounter (Signed)
Called pt to review results and confirm pharmacy. No answer. LVM.

## 2021-05-02 NOTE — Telephone Encounter (Signed)
Reviewed [facility] portal.  CMET/CBC/Hgba1c WNL.  Vitamin D low at 10.2 patient to start cholecalciferol 50,000 units po weekly.   Please verify patient pharmacy preference for Rx.  Will send electronical Rx #[id number] to pharmacy of choice. Take with fattiest meal of the day x 12 weeks.  Repeat nonfasting vitamin d level in 3 months.  Goal 30-60 level.  Exitcare handout on vitamin D deficiency.  Get 15 minutes sunlight on skin daily.  Avoid weight gain.  I recommend exercise 150 minutes/week, eating 30 grams fiber per day.  Consider dash diet.  Exitcare handouts dash diet and living with hypertension. Repeat CMET/CBC/Hgba1c fasting labs/weight/BP in 1 year.  My chart message sent to patient. ?

## 2021-05-08 MED ORDER — VITAMIN D (ERGOCALCIFEROL) 1.25 MG (50000 UNIT) PO CAPS: 50000.0000 [IU] | ORAL_CAPSULE | ORAL | 0 refills | Status: AC

## 2021-05-08 NOTE — Telephone Encounter (Signed)
I also notified patient I did not recommend MVI OTC for him at this time.  CMET/CBC/Hgba1c WNL. ?

## 2021-05-08 NOTE — Telephone Encounter (Signed)
Patient left message asking if he can take OTC MVI instead of Rx for vitamin D.  Left message for patient typically OTC MVI do not have sufficient dose to raise his vitamin D level into normal range.  I recommended he start the 50,000 cholecalciferol po weekly and repeat vitamin D level in 3 months nonfasting lab draw.  Discussed sunlight 15 minutes skin per day the main way humans get Vitamin D not through diet.  Best dietary sources are fortified foods with vitamin D typically dairy. ?

## 2021-10-23 ENCOUNTER — Telehealth: Payer: Self-pay | Admitting: Primary Care

## 2021-10-23 DIAGNOSIS — Q245 Malformation of coronary vessels: Secondary | ICD-10-CM

## 2021-10-23 DIAGNOSIS — I201 Angina pectoris with documented spasm: Secondary | ICD-10-CM

## 2021-10-23 DIAGNOSIS — F39 Unspecified mood [affective] disorder: Secondary | ICD-10-CM

## 2021-10-23 NOTE — Telephone Encounter (Signed)
Patient is overdue for CPE/follow up, this will be required prior to any further refills.  Please schedule.   

## 2021-10-23 NOTE — Telephone Encounter (Signed)
Scheduled CPE 11/20/21

## 2021-10-23 NOTE — Telephone Encounter (Signed)
lvmtcb

## 2021-11-20 ENCOUNTER — Encounter: Payer: Self-pay | Admitting: Primary Care

## 2021-11-20 ENCOUNTER — Ambulatory Visit (INDEPENDENT_AMBULATORY_CARE_PROVIDER_SITE_OTHER): Payer: No Typology Code available for payment source | Admitting: Primary Care

## 2021-11-20 VITALS — BP 118/72 | HR 57 | Temp 98.8°F | Ht 73.0 in | Wt 193.0 lb

## 2021-11-20 DIAGNOSIS — Z0000 Encounter for general adult medical examination without abnormal findings: Secondary | ICD-10-CM | POA: Diagnosis not present

## 2021-11-20 DIAGNOSIS — Q245 Malformation of coronary vessels: Secondary | ICD-10-CM

## 2021-11-20 DIAGNOSIS — F39 Unspecified mood [affective] disorder: Secondary | ICD-10-CM | POA: Diagnosis not present

## 2021-11-20 DIAGNOSIS — Z23 Encounter for immunization: Secondary | ICD-10-CM

## 2021-11-20 DIAGNOSIS — I201 Angina pectoris with documented spasm: Secondary | ICD-10-CM

## 2021-11-20 MED ORDER — METOPROLOL SUCCINATE ER 25 MG PO TB24: ORAL_TABLET | ORAL | 3 refills | Status: DC

## 2021-11-20 MED ORDER — CITALOPRAM HYDROBROMIDE 20 MG PO TABS: 20.0000 mg | ORAL_TABLET | Freq: Every day | ORAL | 3 refills | Status: DC

## 2021-11-20 MED ORDER — AMLODIPINE BESYLATE 2.5 MG PO TABS: 2.5000 mg | ORAL_TABLET | Freq: Every day | ORAL | 3 refills | Status: DC

## 2021-11-20 NOTE — Assessment & Plan Note (Signed)
Controlled.  Continue metoprolol succinate 12.5 mg daily and amlodipine 2.5 mg daily. No longer following with cardiology.

## 2021-11-20 NOTE — Progress Notes (Signed)
Subjective:    Patient ID: Warren Sloan, male    DOB: 1980-06-02, 41 y.o.   MRN: [id number]  HPI  Warren Sloan is a very pleasant 41 y.o. male who presents today for complete physical and follow up of chronic conditions.  Immunizations: -Tetanus: 2016 -Influenza: Completed today  Diet: Fair diet.  Exercise: No regular exercise.  Eye exam: Completes annually  Dental exam: Completes semi-annually   BP Readings from Last 3 Encounters:  11/20/21 118/72  05/01/21 110/71  09/04/20 108/72        Review of Systems  Constitutional:  Negative for unexpected weight change.  HENT:  Negative for rhinorrhea.   Respiratory:  Negative for cough and shortness of breath.   Cardiovascular:  Negative for chest pain.  Gastrointestinal:  Negative for constipation and diarrhea.  Genitourinary:  Negative for difficulty urinating.  Musculoskeletal:  Negative for arthralgias and myalgias.  Skin:  Negative for rash.  Allergic/Immunologic: Negative for environmental allergies.  Neurological:  Negative for dizziness and headaches.  Psychiatric/Behavioral:  The patient is not nervous/anxious.          Past Medical History:  Diagnosis Date   Chest pain    Chronic ischemic heart disease    2D ECHO, 07/11/2003 - EF 59%, normal   Myocardial bridge    Shortness of breath    MET TEST, 09/29/2011    Social History   Socioeconomic History   Marital status: Married    Spouse name: Not on file   Number of children: Not on file   Years of education: Not on file   Highest education level: Not on file  Occupational History   Not on file  Tobacco Use   Smoking status: Never   Smokeless tobacco: Never  Substance and Sexual Activity   Alcohol use: Yes    Alcohol/week: 0.0 standard drinks of alcohol    Comment: Occasionally   Drug use: No   Sexual activity: Yes  Other Topics Concern   Not on file  Social History Narrative   Not on file   Social Determinants of Health    Financial Resource Strain: Not on file  Food Insecurity: Not on file  Transportation Needs: Not on file  Physical Activity: Not on file  Stress: Not on file  Social Connections: Not on file  Intimate Partner Violence: Not on file    Past Surgical History:  Procedure Laterality Date   CARDIAC CATHETERIZATION  04/30/2005   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   No current facility-administered medications on file prior to visit.    BP 118/72   Pulse (!) 57   Temp 98.8 F (37.1 C) (Temporal)   Ht _0  (1.854 m)   Wt 193 lb (87.5 kg)   SpO2 99%   BMI 25.46 kg/m  Objective:   Physical Exam HENT:     Right Ear: Tympanic membrane and ear canal normal.     Left Ear: Tympanic membrane and ear canal normal.     Nose: Nose normal.     Right Sinus: No maxillary sinus tenderness or frontal sinus tenderness.  Left Sinus: No maxillary sinus tenderness or frontal sinus tenderness.  Eyes:     Conjunctiva/sclera: Conjunctivae normal.  Neck:     Thyroid: No thyromegaly.     Vascular: No carotid bruit.  Cardiovascular:     Rate and Rhythm: Normal rate and regular rhythm.     Heart sounds: Normal heart sounds.  Pulmonary:     Effort: Pulmonary effort is normal.     Breath sounds: Normal breath sounds. No wheezing or rales.  Abdominal:     General: Bowel sounds are normal.     Palpations: Abdomen is soft.     Tenderness: There is no abdominal tenderness.  Musculoskeletal:        General: Normal range of motion.     Cervical back: Neck supple.  Skin:    General: Skin is warm and dry.  Neurological:     Mental Status: He is alert and oriented to  person, place, and time.     Cranial Nerves: No cranial nerve deficit.     Deep Tendon Reflexes: Reflexes are normal and symmetric.  Psychiatric:        Mood and Affect: Mood normal.           Assessment & Plan:   Problem List Items Addressed This Visit       Cardiovascular and Mediastinum   Myocardial bridge    Controlled.  Continue metoprolol succinate 12.5 mg daily and amlodipine 2.5 mg daily. No longer following with cardiology.       Relevant Medications   amLODipine (NORVASC) 2.5 MG tablet   metoprolol succinate (TOPROL-XL) 25 MG 24 hr tablet   Coronary artery spasm (HCC)   Relevant Medications   amLODipine (NORVASC) 2.5 MG tablet   metoprolol succinate (TOPROL-XL) 25 MG 24 hr tablet     Other   Episodic mood disorder (HCC)    Controlled.  We discussed potentially weaning off of citalopram as he's been stable for so long. He will think about this.  Recommend reducing down to 10 mg x 1-2 months, then stop.   Continue citalopram 20 mg daily for now. He will update if he reduces his dose.      Relevant Medications   citalopram (CELEXA) 20 MG tablet   Preventative health care    Immunizations UTD.  Discussed the importance of a healthy diet and regular exercise in order for weight loss, and to reduce the risk of further co-morbidity.  Exam stable. Labs reviewed from employer  Follow up in 1 year for repeat physical.       Other Visit Diagnoses     Need for immunization against influenza    -  Primary   Relevant Orders   Flu Vaccine QUAD 83moIM (Fluarix, Fluzone & Alfiuria Quad PF) (Completed)          KPleas Koch NP

## 2021-11-20 NOTE — Assessment & Plan Note (Signed)
Controlled.  We discussed potentially weaning off of citalopram as he's been stable for so long. He will think about this.  Recommend reducing down to 10 mg x 1-2 months, then stop.   Continue citalopram 20 mg daily for now. He will update if he reduces his dose.

## 2021-11-20 NOTE — Patient Instructions (Signed)
Start vitamin D3 5000 IU once daily for low vitamin D.  Schedule a lab only appointment for 3 months to recheck levels.  It was a pleasure to see you today!  Preventive Care 35-42 Years Old, Male Preventive care refers to lifestyle choices and visits with your health care provider that can promote health and wellness. Preventive care visits are also called wellness exams. What can I expect for my preventive care visit? Counseling During your preventive care visit, your health care provider may ask about your: Medical history, including: Past medical problems. Family medical history. Current health, including: Emotional well-being. Home life and relationship well-being. Sexual activity. Lifestyle, including: Alcohol, nicotine or tobacco, and drug use. Access to firearms. Diet, exercise, and sleep habits. Safety issues such as seatbelt and bike helmet use. Sunscreen use. Work and work Astronomer. Physical exam Your health care provider will check your: Height and weight. These may be used to calculate your BMI (body mass index). BMI is a measurement that tells if you are at a healthy weight. Waist circumference. This measures the distance around your waistline. This measurement also tells if you are at a healthy weight and may help predict your risk of certain diseases, such as type 2 diabetes and high blood pressure. Heart rate and blood pressure. Body temperature. Skin for abnormal spots. What immunizations do I need?  Vaccines are usually given at various ages, according to a schedule. Your health care provider will recommend vaccines for you based on your age, medical history, and lifestyle or other factors, such as travel or where you work. What tests do I need? Screening Your health care provider may recommend screening tests for certain conditions. This may include: Lipid and cholesterol levels. Diabetes screening. This is done by checking your blood sugar (glucose) after  you have not eaten for a while (fasting). Hepatitis B test. Hepatitis C test. HIV (human immunodeficiency virus) test. STI (sexually transmitted infection) testing, if you are at risk. Lung cancer screening. Prostate cancer screening. Colorectal cancer screening. Talk with your health care provider about your test results, treatment options, and if necessary, the need for more tests. Follow these instructions at home: Eating and drinking  Eat a diet that includes fresh fruits and vegetables, whole grains, lean protein, and low-fat dairy products. Take vitamin and mineral supplements as recommended by your health care provider. Do not drink alcohol if your health care provider tells you not to drink. If you drink alcohol: Limit how much you have to 0-2 drinks a day. Know how much alcohol is in your drink. In the [place] one drink equals one 12 oz bottle of beer (355 mL), one 5 oz glass of wine (148 mL), or one 1 oz glass of hard liquor (44 mL). Lifestyle Brush your teeth every morning and night with fluoride toothpaste. Floss one time each day. Exercise for at least 30 minutes 5 or more days each week. Do not use any products that contain nicotine or tobacco. These products include cigarettes, chewing tobacco, and vaping devices, such as e-cigarettes. If you need help quitting, ask your health care provider. Do not use drugs. If you are sexually active, practice safe sex. Use a condom or other form of protection to prevent STIs. Take aspirin only as told by your health care provider. Make sure that you understand how much to take and what form to take. Work with your health care provider to find out whether it is safe and beneficial for you to take aspirin daily.  Find healthy ways to manage stress, such as: Meditation, yoga, or listening to music. Journaling. Talking to a trusted person. Spending time with friends and family. Minimize exposure to UV radiation to reduce your risk of skin  cancer. Safety Always wear your seat belt while driving or riding in a vehicle. Do not drive: If you have been drinking alcohol. Do not ride with someone who has been drinking. When you are tired or distracted. While texting. If you have been using any mind-altering substances or drugs. Wear a helmet and other protective equipment during sports activities. If you have firearms in your house, make sure you follow all gun safety procedures. What's next? Go to your health care provider once a year for an annual wellness visit. Ask your health care provider how often you should have your eyes and teeth checked. Stay up to date on all vaccines. This information is not intended to replace advice given to you by your health care provider. Make sure you discuss any questions you have with your health care provider. Document Revised: 06/19/2020 Document Reviewed: 06/19/2020 Elsevier Patient Education  2023 [employer].

## 2021-11-20 NOTE — Assessment & Plan Note (Signed)
Immunizations UTD.  Discussed the importance of a healthy diet and regular exercise in order for weight loss, and to reduce the risk of further co-morbidity.  Exam stable. Labs reviewed from employer  Follow up in 1 year for repeat physical.

## 2022-02-03 ENCOUNTER — Encounter: Payer: Self-pay | Admitting: Registered Nurse

## 2022-02-03 ENCOUNTER — Telehealth: Payer: Self-pay | Admitting: Registered Nurse

## 2022-02-03 DIAGNOSIS — Z0000 Encounter for general adult medical examination without abnormal findings: Secondary | ICD-10-CM

## 2022-02-03 NOTE — Telephone Encounter (Signed)
RN Kimrey notified this am to follow up with patient regarding Vitamin D recheck appt and if he finished supplement.  She reported patient has only taken half of his doses. She encouraged him to continue and schedule lab appt with her once all doses completed.  Lab order extended another month in Epic.  Patient due [facility] labs fasting in April 2024.  RN Kimrey notified and orders entered.

## 2022-03-04 ENCOUNTER — Other Ambulatory Visit: Payer: Self-pay | Admitting: Occupational Medicine

## 2022-03-04 DIAGNOSIS — E559 Vitamin D deficiency, unspecified: Secondary | ICD-10-CM

## 2022-03-04 DIAGNOSIS — Z0000 Encounter for general adult medical examination without abnormal findings: Secondary | ICD-10-CM

## 2022-03-04 NOTE — Progress Notes (Signed)
Lab drawn from Left AC tolerated well no issues noted.

## 2022-03-05 ENCOUNTER — Other Ambulatory Visit: Payer: Self-pay | Admitting: Registered Nurse

## 2022-03-05 ENCOUNTER — Encounter: Payer: Self-pay | Admitting: Registered Nurse

## 2022-03-05 DIAGNOSIS — Z8639 Personal history of other endocrine, nutritional and metabolic disease: Secondary | ICD-10-CM

## 2022-03-05 LAB — CMP12+LP+TP+TSH+6AC+CBC/D/PLT
ALT: 37 IU/L (ref 0–44)
AST: 34 IU/L (ref 0–40)
Albumin/Globulin Ratio: 2 (ref 1.2–2.2)
Albumin: 4.8 g/dL (ref 4.1–5.1)
Alkaline Phosphatase: 75 IU/L (ref 44–121)
BUN/Creatinine Ratio: 8 — ABNORMAL LOW (ref 9–20)
BUN: 10 mg/dL (ref 6–24)
Basophils Absolute: 0.1 10*3/uL (ref 0.0–0.2)
Basos: 1 %
Bilirubin Total: 0.3 mg/dL (ref 0.0–1.2)
Calcium: 9.1 mg/dL (ref 8.7–10.2)
Chloride: 103 mmol/L (ref 96–106)
Chol/HDL Ratio: 2.6 ratio (ref 0.0–5.0)
Cholesterol, Total: 191 mg/dL (ref 100–199)
Creatinine, Ser: 1.23 mg/dL (ref 0.76–1.27)
EOS (ABSOLUTE): 0 10*3/uL (ref 0.0–0.4)
Eos: 1 %
Estimated CHD Risk: <0.5 times avg. (ref 0.0–1.0)
Free Thyroxine Index: 2.4 (ref 1.2–4.9)
GGT: 53 IU/L (ref 0–65)
Globulin, Total: 2.4 g/dL (ref 1.5–4.5)
Glucose: 93 mg/dL (ref 70–99)
HDL: 73 mg/dL (ref >39)
Hematocrit: 40.7 % (ref 37.5–51.0)
Hemoglobin: 13.5 g/dL (ref 13.0–17.7)
Immature Grans (Abs): 0 10*3/uL (ref 0.0–0.1)
Immature Granulocytes: 0 %
Iron: 79 ug/dL (ref 38–169)
LDH: 140 IU/L (ref 121–224)
LDL Chol Calc (NIH): 106 mg/dL — ABNORMAL HIGH (ref 0–99)
Lymphocytes Absolute: 1.7 10*3/uL (ref 0.7–3.1)
Lymphs: 41 %
MCH: 31.5 pg (ref 26.6–33.0)
MCHC: 33.2 g/dL (ref 31.5–35.7)
MCV: 95 fL (ref 79–97)
Monocytes Absolute: 0.3 10*3/uL (ref 0.1–0.9)
Monocytes: 7 %
Neutrophils Absolute: 2 10*3/uL (ref 1.4–7.0)
Neutrophils: 50 %
Phosphorus: 2.8 mg/dL (ref 2.8–4.1)
Platelets: 246 10*3/uL (ref 150–450)
Potassium: 4.1 mmol/L (ref 3.5–5.2)
RBC: 4.28 x10E6/uL (ref 4.14–5.80)
RDW: 12.6 % (ref 11.6–15.4)
Sodium: 143 mmol/L (ref 134–144)
T3 Uptake Ratio: 28 % (ref 24–39)
T4, Total: 8.6 ug/dL (ref 4.5–12.0)
TSH: 2.01 u[IU]/mL (ref 0.450–4.500)
Total Protein: 7.2 g/dL (ref 6.0–8.5)
Triglycerides: 63 mg/dL (ref 0–149)
Uric Acid: 5.3 mg/dL (ref 3.8–8.4)
VLDL Cholesterol Cal: 12 mg/dL (ref 5–40)
WBC: 4.1 10*3/uL (ref 3.4–10.8)
eGFR: 75 mL/min/{1.73_m2} (ref >59)

## 2022-03-05 LAB — VITAMIN D 25 HYDROXY (VIT D DEFICIENCY, FRACTURES): Vit D, 25-Hydroxy: 43.9 ng/mL (ref 30.0–100.0)

## 2022-03-05 LAB — HEMOGLOBIN A1C
Est. average glucose Bld gHb Est-mCnc: 111 mg/dL
Hgb A1c MFr Bld: 5.5 % (ref 4.8–5.6)

## 2022-04-01 ENCOUNTER — Ambulatory Visit: Payer: Self-pay | Admitting: Occupational Medicine

## 2022-04-01 DIAGNOSIS — Z0000 Encounter for general adult medical examination without abnormal findings: Secondary | ICD-10-CM

## 2022-04-01 NOTE — Progress Notes (Signed)
[facility] insurance premium discount evaluation:   Epic reviewed by RN Kimrey and transcribed. Labs  Tobacco attestation signed. [facility] ROI formed signed. Forms placed in the chart.   Patient given handouts for [facility] pharmacies and discount drugs list,MyChart, Tele doc setup, Tele doc Behavioral, [place] counseling and [facility] counseling.  What to do for infectious illness protocol. Given handout for list of medications that can be filled at [facility]. Given Clinic hours and Clinic Email.  

## 2022-06-21 NOTE — Progress Notes (Signed)
Noted follow up labs have been completed 

## 2022-08-02 ENCOUNTER — Other Ambulatory Visit: Payer: Self-pay | Admitting: Registered Nurse

## 2022-08-02 DIAGNOSIS — Z8639 Personal history of other endocrine, nutritional and metabolic disease: Secondary | ICD-10-CM | POA: Insufficient documentation

## 2022-08-02 NOTE — Progress Notes (Signed)
Patient met with RN Bess Kinds discussed results/NP recommendations 04/01/22 and RN Bess Kinds gave patient printed copy results/results note; BP 127/81 LDL 106 Hgba1c 5.5 weight 462VOJ 11 lb gain from previous year; NP signed provider paperwork 04/02/22 RN Kimrey notified HR team patient met requirements for 2025 FY insurance discount My chart message sent to patient today Warren Sloan, I have cancelled the follow up vitamin D level recheck for next year as new national guidelines state to take low dose supplement and no rechecking of levels on annual basis.  Continue your 5000 units by mouth daily with meal as your level was normal at 43 in February.  Please let us know if you have further questions.  Sincerely,  Albina Billet NP-C

## 2022-10-27 ENCOUNTER — Other Ambulatory Visit: Payer: Self-pay | Admitting: Primary Care

## 2022-10-27 DIAGNOSIS — I201 Angina pectoris with documented spasm: Secondary | ICD-10-CM

## 2022-10-27 DIAGNOSIS — Q245 Malformation of coronary vessels: Secondary | ICD-10-CM

## 2022-10-27 DIAGNOSIS — F39 Unspecified mood [affective] disorder: Secondary | ICD-10-CM

## 2022-10-27 NOTE — Telephone Encounter (Signed)
Patient is due for CPE/follow up in mid to late November, this will be required prior to any further refills.  Please schedule, thank you!

## 2022-10-27 NOTE — Telephone Encounter (Signed)
Lvm for patient tcb and schedule 

## 2022-11-04 ENCOUNTER — Ambulatory Visit: Payer: No Typology Code available for payment source

## 2022-11-04 DIAGNOSIS — Z23 Encounter for immunization: Secondary | ICD-10-CM

## 2023-01-02 ENCOUNTER — Other Ambulatory Visit: Payer: Self-pay | Admitting: Primary Care

## 2023-01-02 DIAGNOSIS — Q245 Malformation of coronary vessels: Secondary | ICD-10-CM

## 2023-01-02 DIAGNOSIS — I201 Angina pectoris with documented spasm: Secondary | ICD-10-CM

## 2023-01-02 DIAGNOSIS — F39 Unspecified mood [affective] disorder: Secondary | ICD-10-CM

## 2023-01-08 ENCOUNTER — Encounter: Payer: Self-pay | Admitting: Primary Care

## 2023-01-08 ENCOUNTER — Ambulatory Visit (INDEPENDENT_AMBULATORY_CARE_PROVIDER_SITE_OTHER): Payer: No Typology Code available for payment source | Admitting: Primary Care

## 2023-01-08 VITALS — BP 120/74 | HR 73 | Temp 97.3°F | Ht 72.5 in | Wt 199.0 lb

## 2023-01-08 DIAGNOSIS — Q245 Malformation of coronary vessels: Secondary | ICD-10-CM | POA: Diagnosis not present

## 2023-01-08 DIAGNOSIS — F39 Unspecified mood [affective] disorder: Secondary | ICD-10-CM

## 2023-01-08 DIAGNOSIS — Z0000 Encounter for general adult medical examination without abnormal findings: Secondary | ICD-10-CM

## 2023-01-08 NOTE — Assessment & Plan Note (Signed)
 Stable.  Continue metoprolol succinate 12.5 mg daily amlodipine 2.5 mg daily.

## 2023-01-08 NOTE — Assessment & Plan Note (Signed)
 Controlled.  Agree to wean off citalopram.  Recommended reduction of 10 mg daily for several weeks, then stop. He will update if there are any issues.

## 2023-01-08 NOTE — Progress Notes (Signed)
 Subjective:    Patient ID: Warren Sloan, male    DOB: Mar 30, 1980, 43 y.o.   MRN: [id number]  HPI  Warren Sloan is a very pleasant 43 y.o. male who presents today for complete physical and follow up of chronic conditions.  He would like to wean off citalopram  as he's been feeling well.   Immunizations: -Tetanus: Completed in 2016 -Influenza: Completed this season   Diet: Fair diet.  Exercise: No regular exercise.  Eye exam: Completed several years ago.  Dental exam: Completes semi-annually    BP Readings from Last 3 Encounters:  01/08/23 120/74  03/04/22 127/81  11/20/21 118/72     Review of Systems  Constitutional:  Negative for unexpected weight change.  HENT:  Negative for rhinorrhea.   Respiratory:  Negative for cough and shortness of breath.   Cardiovascular:  Negative for chest pain.  Gastrointestinal:  Negative for constipation and diarrhea.  Genitourinary:  Negative for difficulty urinating.  Musculoskeletal:  Negative for arthralgias and myalgias.  Skin:  Negative for rash.  Allergic/Immunologic: Negative for environmental allergies.  Neurological:  Negative for dizziness, numbness and headaches.  Psychiatric/Behavioral:  The patient is not nervous/anxious.          Past Medical History:  Diagnosis Date   Chest pain    Chronic ischemic heart disease    2D ECHO, 07/11/2003 - EF 59%, normal   Myocardial bridge    Shortness of breath    MET TEST, 09/29/2011    Social History   Socioeconomic History   Marital status: Married    Spouse name: Not on file   Number of children: Not on file   Years of education: Not on file   Highest education level: Master's degree (e.g., MA, MS, MEng, MEd, MSW, MBA)  Occupational History   Not on file  Tobacco Use   Smoking status: Never   Smokeless tobacco: Never  Substance and Sexual Activity   Alcohol use: Yes    Alcohol/week: 0.0 standard drinks of alcohol    Comment: Occasionally   Drug use: No    Sexual activity: Yes  Other Topics Concern   Not on file  Social History Narrative   Not on file   Social Drivers of Health   Financial Resource Strain: Low Risk  (01/07/2023)   Overall Financial Resource Strain (CARDIA)    Difficulty of Paying Living Expenses: Not very hard  Food Insecurity: No Food Insecurity (01/07/2023)   Hunger Vital Sign    Worried About Running Out of Food in the Last Year: Never true    Ran Out of Food in the Last Year: Never true  Transportation Needs: No Transportation Needs (01/07/2023)   PRAPARE - Administrator, Civil Service (Medical): No    Lack of Transportation (Non-Medical): No  Physical Activity: Sufficiently Active (01/07/2023)   Exercise Vital Sign    Days of Exercise per Week: 5 days    Minutes of Exercise per Session: 40 min  Stress: No Stress Concern Present (01/07/2023)   [employer] of Occupational Health - Occupational Stress Questionnaire    Feeling of Stress : Only a little  Social Connections: Unknown (01/07/2023)   Social Connection and Isolation Panel [NHANES]    Frequency of Communication with Friends and Family: Patient declined    Frequency of Social Gatherings with Friends and Family: Patient declined    Attends Religious Services: More than 4 times per year    Active Member of [employer] or Organizations:  Yes    Attends Club or Organization Meetings: More than 4 times per year    Marital Status: Married  Catering Manager Violence: Not on file    Past Surgical History:  Procedure Laterality Date   CARDIAC CATHETERIZATION  04/30/2005   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, no further significant coronary disease   wisdom teeth      Family History  Problem Relation Age of Onset   CVA Maternal Grandmother 50   Irritable bowel syndrome Paternal Grandmother    Hypertension Paternal Grandfather    Cancer Neg Hx     No Known Allergies  Current Outpatient Medications on File Prior to Visit  Medication Sig Dispense  Refill   amLODipine  (NORVASC ) 2.5 MG tablet Take 1 tablet by mouth once daily for blood pressure 30 tablet 0   metoprolol  succinate (TOPROL -XL) 25 MG 24 hr tablet Take 1/2 (one-half) tablet by mouth once daily 15 tablet 0   No current facility-administered medications on file prior to visit.    BP 120/74   Pulse 73   Temp (!) 97.3 F (36.3 C) (Temporal)   Ht 6' 0.5 (1.842 m)   Wt 199 lb (90.3 kg)   SpO2 95%   BMI 26.62 kg/m  Objective:   Physical Exam HENT:     Right Ear: Tympanic membrane and ear canal normal.     Left Ear: Tympanic membrane and ear canal normal.  Eyes:     Pupils: Pupils are equal, round, and reactive to light.  Cardiovascular:     Rate and Rhythm: Normal rate and regular rhythm.  Pulmonary:     Effort: Pulmonary effort is normal.     Breath sounds: Normal breath sounds.  Abdominal:     General: Bowel sounds are normal.     Palpations: Abdomen is soft.     Tenderness: There is no abdominal tenderness.  Musculoskeletal:        General: Normal range of motion.     Cervical back: Neck supple.  Skin:    General: Skin is warm and dry.  Neurological:     Mental Status: He is alert and oriented to person, place, and time.     Cranial Nerves: No cranial nerve deficit.     Deep Tendon Reflexes:     Reflex Scores:      Patellar reflexes are 2+ on the right side and 2+ on the left side. Psychiatric:        Mood and Affect: Mood normal.           Assessment & Plan:  Preventative health care Assessment & Plan: Immunizations UTD.  Discussed the importance of a healthy diet and regular exercise in order for weight loss, and to reduce the risk of further co-morbidity.  Exam stable. Labs reviewed from February 2024 through employer.  He will have repeat labs this February.  Follow up in 1 year for repeat physical.    Myocardial bridge Assessment & Plan: Stable.  Continue metoprolol  succinate 12.5 mg daily amlodipine  2.5 mg daily.   Episodic  mood disorder (HCC) Assessment & Plan: Controlled.  Agree to wean off citalopram .  Recommended reduction of 10 mg daily for several weeks, then stop. He will update if there are any issues.         Zyshonne Malecha K Antwian Santaana, NP

## 2023-01-08 NOTE — Patient Instructions (Signed)
 To wean off your citalopram take 1/2 tablet daily for several weeks, then stop.  Please update me if you experience any problems weaning off the medication.  It was a pleasure to see you today!

## 2023-01-08 NOTE — Assessment & Plan Note (Signed)
 Immunizations UTD.  Discussed the importance of a healthy diet and regular exercise in order for weight loss, and to reduce the risk of further co-morbidity.  Exam stable. Labs reviewed from February 2024 through employer.  He will have repeat labs this February.  Follow up in 1 year for repeat physical.

## 2023-02-03 ENCOUNTER — Other Ambulatory Visit: Payer: Self-pay | Admitting: Primary Care

## 2023-02-03 DIAGNOSIS — Q245 Malformation of coronary vessels: Secondary | ICD-10-CM

## 2023-02-03 DIAGNOSIS — I201 Angina pectoris with documented spasm: Secondary | ICD-10-CM

## 2023-05-11 ENCOUNTER — Other Ambulatory Visit: Payer: Self-pay | Admitting: Primary Care

## 2023-05-11 DIAGNOSIS — F39 Unspecified mood [affective] disorder: Secondary | ICD-10-CM

## 2023-05-11 MED ORDER — CITALOPRAM HYDROBROMIDE 20 MG PO TABS: 20.0000 mg | ORAL_TABLET | Freq: Every day | ORAL | 1 refills | Status: DC

## 2023-05-25 ENCOUNTER — Other Ambulatory Visit: Payer: Self-pay

## 2023-05-25 VITALS — BP 130/71 | Ht 73.0 in | Wt 192.0 lb

## 2023-05-25 DIAGNOSIS — Z0000 Encounter for general adult medical examination without abnormal findings: Secondary | ICD-10-CM

## 2023-05-25 NOTE — Progress Notes (Signed)
 [facility]

## 2023-05-26 ENCOUNTER — Ambulatory Visit: Payer: Self-pay | Admitting: Registered Nurse

## 2023-05-26 DIAGNOSIS — R7989 Other specified abnormal findings of blood chemistry: Secondary | ICD-10-CM

## 2023-05-26 LAB — CMP12+LP+TP+TSH+6AC+CBC/D/PLT
ALT: 20 IU/L (ref 0–44)
AST: 24 IU/L (ref 0–40)
Albumin: 4.4 g/dL (ref 4.1–5.1)
Alkaline Phosphatase: 77 IU/L (ref 44–121)
BUN/Creatinine Ratio: 15 (ref 9–20)
BUN: 20 mg/dL (ref 6–24)
Basophils Absolute: 0 10*3/uL (ref 0.0–0.2)
Basos: 1 %
Bilirubin Total: 0.6 mg/dL (ref 0.0–1.2)
Calcium: 9.3 mg/dL (ref 8.7–10.2)
Chloride: 102 mmol/L (ref 96–106)
Chol/HDL Ratio: 2.4 ratio (ref 0.0–5.0)
Cholesterol, Total: 173 mg/dL (ref 100–199)
Creatinine, Ser: 1.3 mg/dL — ABNORMAL HIGH (ref 0.76–1.27)
EOS (ABSOLUTE): 0.1 10*3/uL (ref 0.0–0.4)
Eos: 1 %
Estimated CHD Risk: <0.5 times avg. (ref 0.0–1.0)
Free Thyroxine Index: 2.1 (ref 1.2–4.9)
GGT: 45 IU/L (ref 0–65)
Globulin, Total: 2.5 g/dL (ref 1.5–4.5)
Glucose: 83 mg/dL (ref 70–99)
HDL: 73 mg/dL (ref >39)
Hematocrit: 43.7 % (ref 37.5–51.0)
Hemoglobin: 14.4 g/dL (ref 13.0–17.7)
Immature Grans (Abs): 0 10*3/uL (ref 0.0–0.1)
Immature Granulocytes: 0 %
Iron: 121 ug/dL (ref 38–169)
LDH: 133 IU/L (ref 121–224)
LDL Chol Calc (NIH): 87 mg/dL (ref 0–99)
Lymphocytes Absolute: 1.9 10*3/uL (ref 0.7–3.1)
Lymphs: 45 %
MCH: 32.4 pg (ref 26.6–33.0)
MCHC: 33 g/dL (ref 31.5–35.7)
MCV: 98 fL — ABNORMAL HIGH (ref 79–97)
Monocytes Absolute: 0.4 10*3/uL (ref 0.1–0.9)
Monocytes: 8 %
Neutrophils Absolute: 1.9 10*3/uL (ref 1.4–7.0)
Neutrophils: 45 %
Phosphorus: 3.2 mg/dL (ref 2.8–4.1)
Platelets: 227 10*3/uL (ref 150–450)
Potassium: 4.6 mmol/L (ref 3.5–5.2)
RBC: 4.45 x10E6/uL (ref 4.14–5.80)
RDW: 12.6 % (ref 11.6–15.4)
Sodium: 140 mmol/L (ref 134–144)
T3 Uptake Ratio: 27 % (ref 24–39)
T4, Total: 7.7 ug/dL (ref 4.5–12.0)
TSH: 1.31 u[IU]/mL (ref 0.450–4.500)
Total Protein: 6.9 g/dL (ref 6.0–8.5)
Triglycerides: 68 mg/dL (ref 0–149)
Uric Acid: 5.5 mg/dL (ref 3.8–8.4)
VLDL Cholesterol Cal: 13 mg/dL (ref 5–40)
WBC: 4.3 10*3/uL (ref 3.4–10.8)
eGFR: 69 mL/min/{1.73_m2} (ref >59)

## 2023-05-26 LAB — HEMOGLOBIN A1C
Est. average glucose Bld gHb Est-mCnc: 108 mg/dL
Hgb A1c MFr Bld: 5.4 % (ref 4.8–5.6)

## 2023-05-26 NOTE — Progress Notes (Signed)
 My chart message sent to patient Warren Sloan on your labs meeting requirements for insurance discount starting 06 Oct 2023. BP 130/71 LDL less than 130 and A1c less than 7.  I will given HR Buddy Carl tomorrow when onsite.  See RN Thersia Flax if you want printed copy of results or results to be sent to another provider.   Follow up with PCM elevated CREATININE (kidney function) Continue vitamin D  5000 units by mouth daily over the counter  Take supplement with meal.   Exitcare handouts on creatinine test, CKD and CKD diet sent to your my chart account.  Have you been sick recently?  I recommend avoiding dehydration  drink water to keep urine pale yellow clear and voiding every 2-4 hours while awake.  I recommend repeat nonfasting BMET in 6 months.  and avoid NSAIDS e.g. motrin/advil/aleve/naproxen/ibuprofen/diclofenac/mobic/meloxicam..  Repeat all other labs fasting in 1 year.  I recommend exercise 150 minutes per week; dietary fiber 30 grams per day men; eat whole grains/fruits/vegetables; keep added sugars to less than 150 calories/9 teaspoons for men per American Heart Association; blood sugar, electrolytes, iron, liver/thyroid function, and complete blood count essentially normal  Please let us  know if you have further questions or concerns. Sincerely, Sherilyn Dings NP-C

## 2023-10-31 ENCOUNTER — Other Ambulatory Visit: Payer: Self-pay | Admitting: Primary Care

## 2023-10-31 DIAGNOSIS — F39 Unspecified mood [affective] disorder: Secondary | ICD-10-CM

## 2023-11-01 NOTE — Telephone Encounter (Signed)
 Patient is due for CPE/follow up in early January 2026, this will be required prior to any further refills.  Please schedule, thank you!

## 2023-11-01 NOTE — Telephone Encounter (Signed)
 Lvm and sent my chart message

## 2023-11-23 ENCOUNTER — Other Ambulatory Visit: Payer: Self-pay | Admitting: Primary Care

## 2023-11-23 DIAGNOSIS — I201 Angina pectoris with documented spasm: Secondary | ICD-10-CM

## 2023-11-23 DIAGNOSIS — Q245 Malformation of coronary vessels: Secondary | ICD-10-CM

## 2023-11-29 ENCOUNTER — Encounter: Payer: Self-pay | Admitting: Registered Nurse

## 2023-11-30 ENCOUNTER — Other Ambulatory Visit: Payer: Self-pay | Admitting: General Practice

## 2024-01-11 ENCOUNTER — Encounter: Payer: Self-pay | Admitting: Primary Care

## 2024-01-11 ENCOUNTER — Ambulatory Visit (INDEPENDENT_AMBULATORY_CARE_PROVIDER_SITE_OTHER): Admitting: Primary Care

## 2024-01-11 VITALS — BP 122/70 | HR 57 | Temp 98.0°F | Ht 72.5 in | Wt 195.4 lb

## 2024-01-11 DIAGNOSIS — Z0000 Encounter for general adult medical examination without abnormal findings: Secondary | ICD-10-CM | POA: Diagnosis not present

## 2024-01-11 DIAGNOSIS — F39 Unspecified mood [affective] disorder: Secondary | ICD-10-CM

## 2024-01-11 DIAGNOSIS — Q245 Malformation of coronary vessels: Secondary | ICD-10-CM | POA: Diagnosis not present

## 2024-01-11 DIAGNOSIS — Z23 Encounter for immunization: Secondary | ICD-10-CM

## 2024-01-11 NOTE — Assessment & Plan Note (Signed)
Controlled.  Continue citalopram 20 mg daily.

## 2024-01-11 NOTE — Assessment & Plan Note (Signed)
 Stable.  The metoprolol  succinate 12.5 milligrams daily, amlodipine  2.5 mg daily.

## 2024-01-11 NOTE — Patient Instructions (Signed)
 It was a pleasure to see you today!

## 2024-01-11 NOTE — Assessment & Plan Note (Signed)
" °  Influenza vaccine provided today. Declines tetanus vaccine at this point.  Discussed the importance of a healthy diet and regular exercise in order for weight loss, and to reduce the risk of further co-morbidity.  Exam stable. Labs reviewed from May 2025.  Follow up in 1 year for repeat physical.  "

## 2024-01-11 NOTE — Progress Notes (Signed)
 "  Subjective:    Patient ID: Warren Sloan, male    DOB: [birth year], 44 y.o.   MRN: [id number]  Warren Sloan is a very pleasant 44 y.o. male who presents today for complete physical and follow up of chronic conditions.  Immunizations: -Tetanus: Completed in 2016, declines today. -Influenza: Influenza vaccine provided today.   Diet: Fair diet.  Exercise: Regular exercise.  Eye exam: Completed > 1 year ago.  Dental exam: Completes semi-annually    BP Readings from Last 3 Encounters:  01/11/24 122/70  05/25/23 130/71  01/08/23 120/74         Review of Systems  Constitutional:  Negative for unexpected weight change.  HENT:  Negative for rhinorrhea.   Respiratory:  Negative for cough and shortness of breath.   Cardiovascular:  Negative for chest pain.  Gastrointestinal:  Negative for constipation and diarrhea.  Genitourinary:  Negative for difficulty urinating.  Musculoskeletal:  Negative for arthralgias and myalgias.  Skin:  Negative for rash.  Allergic/Immunologic: Negative for environmental allergies.  Neurological:  Negative for dizziness and headaches.  Psychiatric/Behavioral:  The patient is not nervous/anxious.          Past Medical History:  Diagnosis Date   Anxiety    Chest pain    Chronic ischemic heart disease    2D ECHO, 07/11/2003 - EF 59%, normal   Depression    Myocardial bridge    Myocardial infarction (HCC)    Shortness of breath    MET TEST, 09/29/2011    Social History   Socioeconomic History   Marital status: Married    Spouse name: Not on file   Number of children: Not on file   Years of education: Not on file   Highest education level: Master's degree (e.g., MA, MS, MEng, MEd, MSW, MBA)  Occupational History   Not on file  Tobacco Use   Smoking status: Never   Smokeless tobacco: Never  Substance and Sexual Activity   Alcohol use: Not Currently    Comment: Occasionally   Drug use: Never   Sexual activity: Yes    Birth  control/protection: None  Other Topics Concern   Not on file  Social History Narrative   Not on file   Social Drivers of Health   Tobacco Use: Low Risk (01/11/2024)   Patient History    Smoking Tobacco Use: Never    Smokeless Tobacco Use: Never    Passive Exposure: Not on file  Financial Resource Strain: Patient Declined (01/10/2024)   Overall Financial Resource Strain (CARDIA)    Difficulty of Paying Living Expenses: Patient declined  Food Insecurity: Patient Declined (01/10/2024)   Epic    Worried About Programme Researcher, Broadcasting/film/video in the Last Year: Patient declined    Barista in the Last Year: Patient declined  Transportation Needs: Patient Declined (01/10/2024)   Epic    Lack of Transportation (Medical): Patient declined    Lack of Transportation (Non-Medical): Patient declined  Physical Activity: Sufficiently Active (01/10/2024)   Exercise Vital Sign    Days of Exercise per Week: 6 days    Minutes of Exercise per Session: 40 min  Stress: No Stress Concern Present (01/10/2024)   [employer] of Occupational Health - Occupational Stress Questionnaire    Feeling of Stress: Only a little  Social Connections: Unknown (01/10/2024)   Social Connection and Isolation Panel    Frequency of Communication with Friends and Family: Once a week    Frequency of Social  Gatherings with Friends and Family: Patient declined    Attends Religious Services: More than 4 times per year    Active Member of Clubs or Organizations: Yes    Attends Engineer, Structural: More than 4 times per year    Marital Status: Married  Catering Manager Violence: Not on file  Depression (PHQ2-9): Low Risk (01/11/2024)   Depression (PHQ2-9)    PHQ-2 Score: 0  Alcohol Screen: Low Risk (01/07/2023)   Alcohol Screen    Last Alcohol Screening Score (AUDIT): 1  Housing: Low Risk (01/07/2023)   Housing Stability Vital Sign    Unable to Pay for Housing in the Last Year: No    Number of Times Moved in the Last Year: 0     Homeless in the Last Year: No  Utilities: Not on file  Health Literacy: Not on file    Past Surgical History:  Procedure Laterality Date   CARDIAC CATHETERIZATION  04/30/2005   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     Left Ear: Tympanic membrane and ear canal normal.  Eyes:     Pupils: Pupils are equal, round, and reactive to light.  Cardiovascular:     Rate and Rhythm: Normal rate and regular rhythm.  Pulmonary:     Effort: Pulmonary effort is normal.     Breath sounds: Normal breath sounds.  Abdominal:     General: Bowel sounds are normal.     Palpations: Abdomen is soft.     Tenderness: There is no abdominal tenderness.  Musculoskeletal:        General: Normal range of motion.     Cervical back: Neck supple.  Skin:    General: Skin is warm and dry.  Neurological:     Mental Status: He is alert and oriented to person, place, and time.     Cranial Nerves: No cranial nerve deficit.     Deep Tendon Reflexes:     Reflex Scores:      Patellar reflexes are 2+ on the right side and 2+ on the left side. Psychiatric:        Mood and Affect: Mood normal.     Physical Exam        Assessment & Plan:  Preventative health care Assessment & Plan:  Influenza vaccine provided today. Declines tetanus vaccine at this point.  Discussed the importance of a healthy diet and regular exercise in order for weight loss, and to reduce the risk of  further co-morbidity.  Exam stable. Labs reviewed from May 2025.  Follow up in 1 year for repeat physical.    Need for influenza vaccination -     Flu vaccine trivalent PF, 6mos and older(Flulaval,Afluria,Fluarix,Fluzone)  Myocardial bridge Assessment & Plan: Stable.  The metoprolol  succinate 12.5 milligrams daily, amlodipine  2.5 mg daily.  Episodic mood disorder Assessment & Plan: Controlled.  Continue citalopram  20 mg daily     Assessment and Plan Assessment & Plan         Comer MARLA Gaskins, NP       [1] No Known Allergies [2]  Current Outpatient Medications on File Prior to Visit  Medication Sig Dispense Refill   amLODipine  (NORVASC ) 2.5 MG tablet Take 1 tablet by mouth once daily for blood pressure 90 tablet 0   citalopram  (CELEXA ) 20 MG tablet TAKE 1 TABLET BY MOUTH DAILY FOR MOOD 90 tablet 0   metoprolol  succinate (TOPROL -XL) 25 MG 24 hr tablet Take 1/2 (one-half) tablet by mouth once daily 45 tablet 0   No current facility-administered medications on file prior to visit.   "

## 2024-01-27 ENCOUNTER — Other Ambulatory Visit: Payer: Self-pay | Admitting: Primary Care

## 2024-01-27 DIAGNOSIS — F39 Unspecified mood [affective] disorder: Secondary | ICD-10-CM
